# Patient Record
Sex: Female | Born: 1935 | Race: White | Hispanic: No
Health system: Midwestern US, Community
[De-identification: ages and names within clinical notes are randomized; demographics above are authoritative.]

## PROBLEM LIST (undated history)

## (undated) DIAGNOSIS — E039 Hypothyroidism, unspecified: Secondary | ICD-10-CM

## (undated) DIAGNOSIS — N649 Disorder of breast, unspecified: Secondary | ICD-10-CM

## (undated) DIAGNOSIS — Z1231 Encounter for screening mammogram for malignant neoplasm of breast: Secondary | ICD-10-CM

## (undated) DIAGNOSIS — R928 Other abnormal and inconclusive findings on diagnostic imaging of breast: Secondary | ICD-10-CM

---

## 2008-06-04 LAB — LIPID PANEL
Cholesterol, Total: 191 mg/dl (ref <200)
HDL: 79 mg/dL — ABNORMAL HIGH (ref 40–60)
LDL Calculated: 104 mg/dl — ABNORMAL HIGH (ref <100)
Triglycerides: 43 mg/dl (ref <150)
VLDL Cholesterol Calculated: 9 mg/dl

## 2008-06-04 LAB — COMPREHENSIVE METABOLIC PANEL
ALT: 21 U/L (ref 10–40)
AST: 31 U/L (ref 15–37)
Albumin: 4.1 g/dL (ref 3.4–5.0)
Alkaline Phosphatase: 41 U/L (ref 25–100)
BUN: 16 mg/dL (ref 7–18)
CO2: 32 meq/L (ref 21–32)
Calcium: 9.2 mg/dL (ref 8.3–10.6)
Chloride: 105 meq/L (ref 99–110)
Creatinine: 0.9 mg/dL (ref 0.6–1.2)
GFR Est, African/Amer: >60
GFR, Estimated: >60 (ref >60)
Glucose: 67 mg/dL — ABNORMAL LOW (ref 70–99)
Potassium: 4.4 meq/L (ref 3.5–5.1)
Sodium: 143 meq/L (ref 136–145)
Total Bilirubin: 0.5 mg/dL (ref 0.0–1.0)
Total Protein: 6.5 g/dL (ref 6.4–8.2)

## 2008-06-04 LAB — TSH: TSH: 3.71 u[IU]/mL (ref 0.35–5.5)

## 2009-05-13 LAB — TSH: TSH: 4.02 u[IU]/mL (ref 0.35–5.5)

## 2010-03-24 ENCOUNTER — Ambulatory Visit: Admit: 2010-03-24

## 2010-03-24 ENCOUNTER — Encounter

## 2010-04-22 ENCOUNTER — Ambulatory Visit: Admit: 2010-04-22

## 2010-06-26 LAB — TSH
T4 Free: 1.18 ng/dl (ref 0.9–1.8)
TSH: 5.84 u[IU]/mL — ABNORMAL HIGH (ref 0.35–5.5)

## 2010-06-26 LAB — LIPID PANEL
Cholesterol, Total: 229 mg/dl — ABNORMAL HIGH (ref <200)
HDL: 94 mg/dl — ABNORMAL HIGH (ref 40–60)
LDL Calculated: 124 mg/dl — ABNORMAL HIGH (ref <100)
Triglycerides: 55 mg/dl (ref <150)
VLDL Cholesterol Calculated: 11 mg/dl

## 2010-06-26 LAB — COMPREHENSIVE METABOLIC PANEL
ALT: 23 U/L (ref 10–40)
AST: 29 U/L (ref 15–37)
Albumin: 4.4 gm/dl (ref 3.4–5.0)
Alkaline Phosphatase: 42 U/L — ABNORMAL LOW (ref 45–129)
BUN: 13 mg/dl (ref 7–18)
CO2: 31 mEq/L (ref 21–32)
Calcium: 9.3 mg/dl (ref 8.3–10.6)
Chloride: 98 mEq/L — ABNORMAL LOW (ref 99–110)
Creatinine: 0.9 mg/dl (ref 0.6–1.2)
GFR Est, African/Amer: >60
GFR, Estimated: >60 (ref >60)
Glucose: 86 mg/dl (ref 70–99)
Potassium: 3.8 mEq/L (ref 3.5–5.1)
Sodium: 137 mEq/L (ref 136–145)
Total Bilirubin: 0.63 mg/dl (ref 0.0–1.0)
Total Protein: 6.9 gm/dl (ref 6.4–8.2)

## 2010-09-20 LAB — TSH: TSH: 1.26 u[IU]/mL (ref 0.35–5.5)

## 2010-11-08 ENCOUNTER — Encounter

## 2010-11-08 NOTE — Telephone Encounter (Signed)
Dose and allergies verified with her paper chart.  Last OV: 09/026/12  Last TSH 09/20/2010:  1.26

## 2011-10-10 ENCOUNTER — Encounter

## 2011-10-20 ENCOUNTER — Encounter

## 2011-10-27 ENCOUNTER — Encounter

## 2011-10-27 NOTE — Telephone Encounter (Signed)
ordered

## 2011-10-27 NOTE — Telephone Encounter (Signed)
On 10/10 patient had a left breast comb view and a dexa scan.  Please fax these orders so they can put into Epic. They need them to complete her chart.  Fax number [phone]

## 2011-10-27 NOTE — Telephone Encounter (Signed)
Who is asking for these orders. I wouldn't think the tests would be done s the order?

## 2011-10-27 NOTE — Telephone Encounter (Signed)
Contacted Lisa at the [facility] and they said they never received the orders. She says that the patient was already scheduled and went ahead and did the tests and would get the orders later. She says the patient had a dexa scan and left breast comb view on 10/20/2011. Would like these orders faxed to [phone].

## 2011-11-03 ENCOUNTER — Inpatient Hospital Stay: Admit: 2011-11-03 | Discharge: 2011-11-03 | Disposition: A | Discharge status: Home / Self Care

## 2011-11-03 NOTE — Discharge Instructions (Signed)

## 2011-11-03 NOTE — ED Provider Notes (Signed)
Patient is a 78 y.o. female presenting with skin laceration. The history is provided by the patient.   Laceration   The incident occurred 1 to 2 hours ago. Pain location: right lower leg. Size: There are 2 wounds.  The longer wound is 7 cm and the second, shorter wound is 4 cm. Injury mechanism: The patient states she walked into a dark room in her house and bumped her leg against a wicker chest. The pain is at a severity of 5/10. The pain has been constant since onset. She reports no foreign bodies present. Her tetanus status is UTD.       Review of Systems   Constitutional: Negative for fever and appetite change.   Musculoskeletal: Negative for back pain, joint swelling and gait problem.   Skin: Positive for wound (Lacerations to the right pretibial region).   Neurological: Negative for weakness and numbness.   All other systems reviewed and are negative.      I have reviewed the following from the nursing documentation:      Prior to Admission medications    Medication Sig Start Date End Date Taking? Authorizing Provider   hydrochlorothiazide (HYDRODIURIL) 25 MG tablet Take 25 mg by mouth daily.   Yes Historical Provider, MD   oxybutynin (DITROPAN-XL) 10 MG CR tablet Take 10 mg by mouth daily.   Yes Historical Provider, MD   clonazePAM (KLONOPIN) 0.5 MG tablet Take 0.5 mg by mouth 2 times daily as needed.   Yes Historical Provider, MD   aspirin 81 MG chewable tablet Take 81 mg by mouth daily.   Yes Historical Provider, MD   traZODone (DESYREL) 50 MG tablet Take 50 mg by mouth nightly.   Yes Historical Provider, MD   levothyroxine (LEVOTHROID) 75 MCG tablet Take 1 tablet by mouth daily. 11/08/10  Yes Montine Circle, MD       Allergies as of 11/03/2011 - Review Complete 11/03/2011   Allergen Reaction Noted   ??? Tape (adhesive tape)  11/08/2010       No past medical history on file.     Surgical History: No past surgical history on file.     Family History:  No family history on file.    History     Social History    ??? Marital Status: Widowed     Spouse Name: N/A     Number of Children: N/A   ??? Years of Education: N/A     Occupational History   ??? Not on file.     Social History Main Topics   ??? Smoking status: Not on file   ??? Smokeless tobacco: Not on file   ??? Alcohol Use: Not on file   ??? Drug Use: Not on file   ??? Sexually Active: Not on file     Other Topics Concern   ??? Not on file     Social History Narrative   ??? No narrative on file       Physical Exam   Vitals reviewed.  Constitutional: She is oriented to person, place, and time. She appears well-developed and well-nourished.   HENT:   Head: Normocephalic and atraumatic.   Right Ear: External ear normal.   Left Ear: External ear normal.   Nose: Nose normal.   Mouth/Throat: Oropharynx is clear and moist.   Eyes: Conjunctivae are normal. Right eye exhibits no discharge. Left eye exhibits no discharge. No scleral icterus.   Neck: Normal range of motion.   Cardiovascular: Intact distal  pulses.    Pulmonary/Chest: Effort normal.   Musculoskeletal:        Right lower leg: She exhibits tenderness and laceration (Two pretibial lacerations/skin tears.  The longer, more inferior wound is approximately 7 cm.  The more superior one is 4 cm.). She exhibits no bony tenderness, no edema and no deformity.        Legs:  Neurological: She is alert and oriented to person, place, and time.   Skin: Skin is warm and dry.   Psychiatric: She has a normal mood and affect.       Procedures  PROCEDURE:  LACERATION REPAIR  Cherlynn Perches or their surrogate had an opportunity to ask questions, and the risks, benefits, and alternatives were discussed. The wound was prepped and draped to maintain a sterile field. A local anesthetic was used to completely anesthetize the wound. It was copiously irrigated. It was explored to its depth in a bloodless field with no sign of tendon, nerve, or vascular injury. No foreign bodies were identified.  The 7 cm laceration/skin tear was approximated with 7 simple  interrupted 4-0 Prolene sutures.  The 4 cm laceration was approximated using 6 simple interrupted 4-0 Prolene sutures. There were no complications during the procedure.  The more inferior part of the 7 cm wound still gaped even with sutures in place.  This was bandaged with mepilex, gauze,Kling and Coban.  I recommended followup with the wound care center.    ED COURSE:  I have independently evaluated this patient.  Her wounds are repaired and bandage.  She is given outpatient followup with her family physician in the wound care center at [facility].  She believes her tetanus vaccination is up-to-date.    MDM    I estimate there is LOW risk for FRACTURE, CELLULITIS, COMPARTMENT SYNDROME, NECROTIZING FASCIITIS, TENDON OR NEUROVASCULAR INJURY, or FOREIGN BODY, thus I consider the discharge disposition reasonable. Also, there is no evidence or peritonitis, sepsis, or toxicity. Cherlynn Perches and I have discussed the diagnosis and risks, and we agree with discharging home to follow-up with their primary doctor. We also discussed returning to the Emergency Department immediately if new or worsening symptoms occur. We have discussed the symptoms which are most concerning (e.g., changing or worsening pain, fever, numbness, weakness, cool or painful digits) that necessitate immediate return.  Final Impression    1. Laceration of right lower leg x 2        Discharge Vital Signs:  Blood pressure 170/95, pulse 75, temperature 97.7 ??F (36.5 ??C), temperature source Oral, resp. rate 16, height 5' 7"$  (1.702 m), weight 145 lb (65.772 kg), SpO2 96.00%.      Almedia Balls, [place]  11/03/11 0221

## 2011-11-03 NOTE — ED Notes (Signed)
Irrigated laceration to right shin with normal saline and hibiclens. Covered with sterile gauze. Patient resting with call light in reach.    Lyndee Hensen  11/03/11 [phone]

## 2011-11-23 NOTE — Discharge Instructions (Signed)
PHYSICIAN ORDERS AND DISCHARGE INSTRUCTIONS    Anesthetic applied to wound in Wound Care Center:  __x__ 4% lidocaine      __x__ 5% lidocaine      Other:____________________      Destiny Salinas [birth year]      Keep weight off wounds and reposition every 2 hours.  Avoid standing for long periods of time.  Apply wraps/stockings in AM and remove at bedtime.  Elevate legs to the level of the heart or above for 30 minutes 4-5 times a day and/or when sitting.  When taking antibiotics take entire prescription as ordered by MD do not stop taking until medicine is all gone.        Wound care:   Clean wound prior to dressing changes with soap and water or saline.  Right Pretib:       Moisten 12 inches Packing gauze with  Safegel and pack towards your head, your foot and to       the outside of your leg       Apply Safegel to the open wound and cover with a dry dressing.         Change daily       Apply single layer tubigrip( light compression) Remove at bedtime         Return on Friday to see Dr Jerald Kief for further evacuation of hematoma,         .         Follow up with Demetria Pore, CNP  In 1 weeks in the wound care center  Call [phone] for any questions or concerns.  Your case manager is Olegario Messier, Char[employer] fundraiser.      Date__________   Time____________      Physician Signature:________________________  Date:___________Time:__________      Labs________________  Xrays________________  Diagnostic tests___________  Referrals_________________  Skin substitutes____________

## 2011-11-23 NOTE — Progress Notes (Signed)
Wound Care Progress Note      Destiny Salinas  AGE: 78 y.o.   GENDER: female  DOD: 01/24/1932  TODAY'S DATE:  11/23/2011    Subjective:       Destiny Salinas is a 78 y.o. female who presents today for wound check. Patient has a traumatic wound which is located on the right pretibial. Current findings:: Wound from fall/trauma approximately 4 weeks ago. Received sutures which have been removed. No pain.[name]       PAST MEDICAL HISTORY        Diagnosis Date   ??? Thyroid disease      hypothyroidism   ??? Psychiatric problem      depression   ??? Hypertension    ??? Urinary frequency        MEDICATIONS    Current Outpatient Prescriptions on File Prior to Encounter   Medication Sig Dispense Refill   ??? ibuprofen (ADVIL;MOTRIN) 200 MG tablet Take 600 mg by mouth every 6 hours as needed.       ??? hydrochlorothiazide (HYDRODIURIL) 25 MG tablet Take 25 mg by mouth daily.       ??? oxybutynin (DITROPAN-XL) 10 MG CR tablet Take 10 mg by mouth daily.       ??? clonazePAM (KLONOPIN) 0.5 MG tablet Take 0.5 mg by mouth 2 times daily as needed.       ??? aspirin 81 MG chewable tablet Take 81 mg by mouth daily.       ??? traZODone (DESYREL) 50 MG tablet Take 50 mg by mouth nightly.       ??? levothyroxine (LEVOTHROID) 75 MCG tablet Take 1 tablet by mouth daily.  90 tablet  3     No current facility-administered medications on file prior to encounter.       ALLERGIES    Allergies   Allergen Reactions   ??? Tape (Adhesive Tape)      And Tedhose       PAST SURGICAL HISTORY    Past Surgical History   Procedure Laterality Date   ??? Fracture surgery       Rt ankle with ORIF w/ 3 pins   ??? Knee arthroscopy       right knee torn meniscus   ??? Appendectomy     ??? Tonsillectomy     ??? Colonoscopy  2006   ??? Eye surgery       bil. cataracts removed w/ lens implants   ??? Hysterectomy         FAMILY HISTORY    family history is not on file.    SOCIAL HISTORY    History   Substance Use Topics   ??? Smoking status: Never Smoker    ???  Smokeless tobacco: Not on file   ??? Alcohol Use: 8.4 oz/week     14 Glasses of wine per week       REVIEW OF SYSTEMS    Patient is awake and alert. MAE. Respirations easy and unlabored. Breath sounds clear A & P. No cough of SOB. No fever, chills, N/V, or CP. Pulse regular. No murmer. PT and DP palpable. Slight edema of right lower leg.       Objective:      BP 140/87   Pulse 73   Temp(Src) 97.4 ??F (36.3 ??C)   Resp 18   Ht 5\' 7"  (1.702 m)   Wt 144 lb (65.318 kg)   BMI 22.55 kg/m2    Wound:   Right lower  leg with dried eschar 70% fluctuant, fading ecchymosis, and dried fibrin. Edema present with hematoma underneath. No s/s infection. No erythema, purulence or induration      (this wound was originally measured on:)  Wound 11/23/11 Other (Comment) Pretibial Right wound #1 Right Pretib-upper (onset 11/02/11)-Wound Length (cm):  (post debridement)  Wound 11/23/11 Other (Comment) Pretibial Right wound #2 Right Pretib-lower (onset 11/02/11)-Wound Length (cm): 4.8 cm (post debridement)  Wound 11/23/11 Other (Comment) Pretibial Right wound #1 Right Pretib-upper (onset 11/02/11)-Wound Width (cm): 2.5 cm  Wound 11/23/11 Other (Comment) Pretibial Right wound #2 Right Pretib-lower (onset 11/02/11)-Wound Width (cm): 3.5 cm  Wound 11/23/11 Other (Comment) Pretibial Right wound #1 Right Pretib-upper (onset 11/02/11)-Depth (cm) : 0.1  Wound 11/23/11 Other (Comment) Pretibial Right wound #2 Right Pretib-lower (onset 11/02/11)-Depth (cm) : 0. Measurements shown are from today's visit.    Assessment:     Patient Active Problem List   Diagnosis   ??? Abnormal mammogram   ??? Menopausal state            Wound check. Care provided includes visual inspection.  Cleansing and thorough irrigation with nss solution and wound packing with wound gel on strip gauze.       Procedure:  With the patient in siting position, Lidocaine soaked gauze was applied per physician order at beginning of wound evaluation. It was subsequently removed.    Using a  scalpel, pickups and scissors ,  the wound was debrided sharply of hematoma,  Fibrotic and necrotic tissue, including a layer of surrounding healthy tissue to stimulate epithelization.    The skin was excised through the level of the subcutaneous tissue .      Wound was copiously irrigated with normal saline solution.   Bleeding was with a small amount of bleeding, and controlled with pressure .    Patient tolerated procedure well and was given proper instruction.  Surgical consult for Dr. Leavy Cella 11/25/11.    Plan:     Plan for wound  Dress per physician order  Treatment: Topical Gel/Ointment and Compression Wrap/Stockings      1. Discussed appropriate home care of this wound.  2. Patient instructions were given.  3. Follow up: 1 week.     Destiny Salinas                                                   [name]

## 2011-11-25 NOTE — Progress Notes (Signed)
Wound Care Progress Note      Destiny Salinas  AGE: 78 y.o.   GENDER: female  DOB: 29-May-1932  TODAY'S DATE:  11/25/2011    Subjective:       Destiny Salinas is a 78 y.o. female who presents today for wound check. Patient has a traumatic wound which is located on the right leg. Current findings:: new: blood clot under calf skin.       PAST MEDICAL HISTORY        Diagnosis Date   ??? Thyroid disease      hypothyroidism   ??? Psychiatric problem      depression   ??? Hypertension    ??? Urinary frequency        MEDICATIONS    Current Outpatient Prescriptions on File Prior to Encounter   Medication Sig Dispense Refill   ??? ibuprofen (ADVIL;MOTRIN) 200 MG tablet Take 600 mg by mouth every 6 hours as needed.       ??? hydrochlorothiazide (HYDRODIURIL) 25 MG tablet Take 25 mg by mouth daily.       ??? oxybutynin (DITROPAN-XL) 10 MG CR tablet Take 10 mg by mouth daily.       ??? clonazePAM (KLONOPIN) 0.5 MG tablet Take 0.5 mg by mouth 2 times daily as needed.       ??? aspirin 81 MG chewable tablet Take 81 mg by mouth daily.       ??? traZODone (DESYREL) 50 MG tablet Take 50 mg by mouth nightly.       ??? levothyroxine (LEVOTHROID) 75 MCG tablet Take 1 tablet by mouth daily.  90 tablet  3     No current facility-administered medications on file prior to encounter.       ALLERGIES    Allergies   Allergen Reactions   ??? Tape (Adhesive Tape)      And Tedhose       PAST SURGICAL HISTORY    Past Surgical History   Procedure Laterality Date   ??? Fracture surgery       Rt ankle with ORIF w/ 3 pins   ??? Knee arthroscopy       right knee torn meniscus   ??? Appendectomy     ??? Tonsillectomy     ??? Colonoscopy  2006   ??? Eye surgery       bil. cataracts removed w/ lens implants   ??? Hysterectomy         FAMILY HISTORY    family history is not on file.    SOCIAL HISTORY    History   Substance Use Topics   ??? Smoking status: Never Smoker    ??? Smokeless tobacco: Not on file   ??? Alcohol Use: 8.4 oz/week     14 Glasses of wine per week        REVIEW OF SYSTEMS    Pertinent items are noted in HPI.      Objective:      There were no vitals taken for this visit.    Wound:   wound margins intact and healing well.  No signs of infection.    (this wound was originally measured on:)  Wound 11/23/11 Other (Comment) Pretibial Right wound #1 Right Pretib-lowerr (onset 11/02/11)-Wound Length (cm): 2.5 cm  Wound 11/23/11 Other (Comment) Pretibial Right wound #2 Right Pretib-lower (onset 11/02/11)-Wound Length (cm): 2.2 cm (post debride)  Wound 11/23/11 Other (Comment) Pretibial Right wound #1 Right Pretib-lowerr (onset 11/02/11)-Wound Width (cm): 2.5 cm  Wound 11/23/11 Other (Comment) Pretibial Right wound #2 Right Pretib-lower (onset 11/02/11)-Wound Width (cm): 2 cm (post debride)  Wound 11/23/11 Other (Comment) Pretibial Right wound #1 Right Pretib-lowerr (onset 11/02/11)-Depth (cm) : 1.2  Wound 11/23/11 Other (Comment) Pretibial Right wound #2 Right Pretib-lower (onset 11/02/11)-Depth (cm) : 0.2 (post debride) Measurements shown are from today's visit.    Assessment:      Wound check. Care provided includes removal of existing dressing  visual inspection  application of clean dressing  Evacuation of blood clot under local anesthesia       Procedure:  With the patient in supine position  1 % lidocaine with epinephrine infiltration anesthesia  Using a curet,  the wound was debrided sharply of all fibrotic, necrotic, and hyperkeratotic tissue and the clot was freed up and expressed from the depths of the undermined skin-fat layer of anterior shin skin.  The open skin wound  was excised through the level of the SubQ, including subQ fat.    Wound was copiously irrigated with normal saline solution.   Bleeding was with a small amount of bleeding, and controlled with pressure .    Patient tolerated procedure well and was given proper instruction.    Problem list:    Patient Active Problem List   Diagnosis   ??? Abnormal mammogram   ??? Menopausal state       Plan:      Plan for wound  Dress per physician order  Treatment: Saline Dressing      1. Discussed appropriate home care of this wound., Dispensed dressing supplies and instructions on their use., Wound re-packed., Wound redressed., and an elastic wrap applied to close the evacuated dead space.  2. Patient instructions were given.  3. Follow up: 5 days.     Philip Aspen [facility]

## 2011-11-25 NOTE — Discharge Instructions (Addendum)
PHYSICIAN ORDERS AND DISCHARGE INSTRUCTIONS    Anesthetic applied to wound in Wound Care Center:  __x__ 4% lidocaine      __x__ 5% lidocaine      Other:_____2% w/ Epinephrine injection_____________      Destiny Salinas 05/10/1932      Keep weight off wounds and reposition every 2 hours.  Consider using Crutches and  Avoid standing for long periods of time.    Elevate legs to the level of the heart or above for 30 minutes 4-5 times a day and/or when sitting.  When taking antibiotics take entire prescription as ordered by MD do not stop taking until medicine is all gone.        Wound care:    Clean wound prior to dressing changes with soap and water or saline.  Right Pretib:      Apply normal saline wet to dry dressing, 4x4, Kerlix, Ace wrap.  Change 3 x day         Leave dressing on today that Dr. Jerald Kief applied until late tomorrow evening 11/26/11    Return on 11/30/11  to see Demetria Pore, CNP  in [facility]         .     Follow up with Demetria Pore, CNP  In 1 weeks in the wound care center  Call [phone] for any questions or concerns.  Your case manager is Olegario Messier, Charity fundraiser.      Date__________   Time____________      Physician Signature:________________________  Date:_11-15-13__________Time:__________      Labs________________  Xrays________________  Diagnostic tests___________  Referrals_________________  Skin substitutes____________

## 2011-11-25 NOTE — Plan of Care (Signed)
Problem: Impaired Skin Integrity  Goal: Skin will be intact without erythema or breakdown  Outcome: Ongoing  Patient seen in [facility] for debridement of right lower leg wound.  Dr. Jerald Kief applied normal saline wet to dry, 4x4's, kerlix, ABD and single layer medium compression Tubigrip.  Patient will return next Wednesday 11/30/11 to see Leo Rod.  Discharge instructions reviewed with patient, all questions answered, copy given to patient. Dressings were applied to all wounds per M.D. at this visit.

## 2011-11-25 NOTE — Progress Notes (Signed)
Initial Evaluation History & Physical    Patient Information:  Destiny Salinas is a 78 y.o. Caucasian female    Chief Complaints:  Sutured right anterior calf wound now filled with clot.    HOPI:  Fell, shearing right anterior calf skin loose from underlying muscle fascia and space filled with blood, now clotted.  Quarter sized flap laceration, now necrotic.    Past Medical History:   has a past medical history of Thyroid disease; Psychiatric problem; Hypertension; and Urinary frequency.     Past Surgical History:   has past surgical history that includes fracture surgery; Knee arthroscopy; Appendectomy; Tonsillectomy; Colonoscopy (2006); eye surgery; and Hysterectomy.     Medications:  Prior to Admission medications    Medication Sig Start Date End Date Taking? Authorizing Provider   ibuprofen (ADVIL;MOTRIN) 200 MG tablet Take 600 mg by mouth every 6 hours as needed.    Historical Provider, MD   hydrochlorothiazide (HYDRODIURIL) 25 MG tablet Take 25 mg by mouth daily.    Historical Provider, MD   oxybutynin (DITROPAN-XL) 10 MG CR tablet Take 10 mg by mouth daily.    Historical Provider, MD   clonazePAM (KLONOPIN) 0.5 MG tablet Take 0.5 mg by mouth 2 times daily as needed.    Historical Provider, MD   aspirin 81 MG chewable tablet Take 81 mg by mouth daily.    Historical Provider, MD   traZODone (DESYREL) 50 MG tablet Take 50 mg by mouth nightly.    Historical Provider, MD   levothyroxine (LEVOTHROID) 75 MCG tablet Take 1 tablet by mouth daily. 11/08/10   Lawrence Santiago, MD       Allergies:  Tape    Family History:  family history is not on file.    Social History:   reports that she has never smoked. She does not have any smokeless tobacco history on file. She reports that she drinks about 8.4 ounces of alcohol per week. She reports that she does not use illicit drugs.     ROS:  System review was done and pertinent positives are mentioned in the HPI.Otherwise all the other system review are negative.    Physical  Exam:  There were no vitals taken for this visit.  General appearance: alert, appears stated age and cooperative  Lungs: clear to auscultation bilaterally  Heart: regular rate and rhythm, S1, S2 normal, no murmur, click, rub or gallop  Abdomen: soft, non-tender; bowel sounds normal; no masses,  no organomegaly  Extremities: palm sized retained clot right anterior calf  Neurologic: Grossly normal  Skin: Skin color, texture, turgor normal. No rashes or lesions    Assessment:  Flensing wound of right calf    Plan:  1. Evacuate clot  2. Compressive dressing  3. Wet to damp center dressing.    Ann Lions, MD 11/25/2011 1:38 PM

## 2011-11-29 NOTE — Progress Notes (Signed)
Talked with Dr. Jerald Kief today re obtaining snap vac for pt- he would have to write a letter of neccesity and rep would need contacted. Pt is leaving for [place] at the end the week. Will hold off on vac for now until pt returns and will be re-evaluated. Pt reports that her wound is less painful, has decreased drainage, seems to be healing well. Pt to see Huntley Dec 11-30-11 in [facility]

## 2011-11-30 NOTE — Plan of Care (Signed)
Problem: Impaired Skin Integrity  Goal: Skin will be intact without erythema or breakdown  Intervention: WOUND CARE  Pt decided that her wound is doing well and she has difficulty getting an appointment in [place]  Her dressings are changed to [employer]

## 2011-11-30 NOTE — Plan of Care (Signed)
Problem: Impaired Skin Integrity  Goal: Skin will be intact without erythema or breakdown  Outcome: Ongoing  Wound is doing much better, no undermining today.  Pt does not want NPWT because she feels the wound  Is getting smaller.  She feels like she can do the dressing herself and was not able to find a time that would work for her visit to [facility]

## 2011-11-30 NOTE — Discharge Instructions (Signed)
PHYSICIAN ORDERS AND DISCHARGE INSTRUCTIONS    Anesthetic applied to wound in Wound Care Center:  __x__ 4% lidocaine      __x__ 5% lidocaine      Other:_____2% w/ Epinephrine injection_____________      Nigel Bridgeman [birth year]      Keep weight off wounds and reposition every 2 hours.  Consider using Crutches and  Avoid standing for long periods of time.    Elevate legs to the level of the heart or above for 30 minutes 4-5 times a day and/or when sitting.  When taking antibiotics take entire prescription as ordered by MD do not stop taking until medicine is all gone.        Wound care:    Clean wound prior to dressing changes with soap and water or saline.  Right Pretib:       A silver dressing has been placed in your wound.       The silver keeps the bacterial count  down.      Cut the dressing slightly larger than the wound and wet it if your wound is dry.  If your       wound is moist, the dressing will absorb the moisture and it is not necessary to wet it.  Cover with a dry dressing, Change daily         .     Follow up   in 2  weeks in the wound care center  Call [phone] for any questions or concerns.  Your case manager is Olegario Messier, Char[employer] fundraiser.  Go to an urgent care center if the wound drainage is foul smelling, redness develops, increased heat to the area    Date__________   Time____________      Physician Signature:________________________  Date:_11-15-13__________Time:__________      Labs________________  Xrays________________  Diagnostic tests___________  Referrals_________________  Skin substitutes____________

## 2011-11-30 NOTE — Progress Notes (Signed)
Wound Care Progress Note      Destiny Salinas  AGE: 78 y.o.   GENDER: female  DOD: [birth year]  TODAY'S DATE:  11/30/2011    Subjective:       Destiny Salinas is a 78 y.o. female who presents today for wound check. Patient has a traumatic wound which is located on the right lower leg. Current findings:: wound healing as expected  Plans for going out of town tomorrow. Unable to get appointment with wound clinic when out of town.[name]       PAST MEDICAL HISTORY        Diagnosis Date   ??? Thyroid disease      hypothyroidism   ??? Psychiatric problem      depression   ??? Hypertension    ??? Urinary frequency        MEDICATIONS    Current Outpatient Prescriptions on File Prior to Encounter   Medication Sig Dispense Refill   ??? ibuprofen (ADVIL;MOTRIN) 200 MG tablet Take 600 mg by mouth every 6 hours as needed.       ??? hydrochlorothiazide (HYDRODIURIL) 25 MG tablet Take 25 mg by mouth daily.       ??? oxybutynin (DITROPAN-XL) 10 MG CR tablet Take 10 mg by mouth daily.       ??? clonazePAM (KLONOPIN) 0.5 MG tablet Take 0.5 mg by mouth 2 times daily as needed.       ??? aspirin 81 MG chewable tablet Take 81 mg by mouth daily.       ??? traZODone (DESYREL) 50 MG tablet Take 50 mg by mouth nightly.       ??? levothyroxine (LEVOTHROID) 75 MCG tablet Take 1 tablet by mouth daily.  90 tablet  3     No current facility-administered medications on file prior to encounter.       ALLERGIES    Allergies   Allergen Reactions   ??? Tape (Adhesive Tape)      And Tedhose       PAST SURGICAL HISTORY    Past Surgical History   Procedure Laterality Date   ??? Fracture surgery       Rt ankle with ORIF w/ 3 pins   ??? Knee arthroscopy       right knee torn meniscus   ??? Appendectomy     ??? Tonsillectomy     ??? Colonoscopy  2006   ??? Eye surgery       bil. cataracts removed w/ lens implants   ??? Hysterectomy         FAMILY HISTORY    family history is not on file.    SOCIAL HISTORY    History   Substance Use Topics   ??? Smoking  status: Never Smoker    ??? Smokeless tobacco: Not on file   ??? Alcohol Use: 8.4 oz/week     14 Glasses of wine per week       REVIEW OF SYSTEMS    Pertinent items are noted in HPI.  Patient awake and alert. MAE> No fever, chills, n/v, sob, cp or reaction to topical dressing. Using cane and elevating leg      Objective:      BP 160/69   Pulse 81   Temp(Src) 97.8 ??F (36.6 ??C) (Oral)   Resp 18    Wound:   wound with red granulation and fibrin. Edges attached. Undermining resolved. Edges open. Surrounding tissue intact. Slight erythema. No c/o pain. No purulence, malodor or  induration.       (this wound was originally measured on:)  Wound 11/23/11 Other (Comment) Pretibial Right wound #1 Right Pretib-lower (onset 11/02/11)-Wound Length (cm): 1.5 cm (post debridement)  Wound 11/23/11 Other (Comment) Pretibial Right wound #2 Right Pretib-lower (onset 11/02/11)-Wound Length (cm): 3.5 cm  Wound 11/23/11 Other (Comment) Pretibial Right wound #1 Right Pretib-lower (onset 11/02/11)-Wound Width (cm): 1.7 cm  Wound 11/23/11 Other (Comment) Pretibial Right wound #2 Right Pretib-lower (onset 11/02/11)-Wound Width (cm): 3.5 cm  Wound 11/23/11 Other (Comment) Pretibial Right wound #1 Right Pretib-lower (onset 11/02/11)-Depth (cm) : 0.9  Wound 11/23/11 Other (Comment) Pretibial Right wound #2 Right Pretib-lower (onset 11/02/11)-Depth (cm) : 0.1 Measurements shown are from today's visit.    Assessment:     Patient Active Problem List   Diagnosis   ??? Abnormal mammogram   ??? Menopausal state            Wound check. Care provided includes cleansing with nss solution, application of clean dressing  And debridement       Procedure:  With the patient in siting position. Lidocaine soaked gauze was applied per physician order at beginning of wound evaluation. It was subsequently removed.    Using a curette,  the wound was debrided sharply of fibrotic tissue, including a layer of surrounding healthy tissue to stimulate epithelization.    The skin  was excised through the level of the subcutaneous tissue .      Wound was copiously irrigated with normal saline solution.   Bleeding was with a small amount of bleeding, and controlled with bleeding .    Patient tolerated procedure well and was given proper instruction.      Plan:     Plan for wound  Dress per physician order  Treatment: Topical Gel/Ointment      1. Discussed appropriate home care of this wound.  2. Patient instructions were given.  3. Follow up: 1 week.     [name]

## 2011-12-14 NOTE — Plan of Care (Signed)
Problem: Impaired Skin Integrity  Goal: Skin will be intact without erythema or breakdown  Intervention: WOUND CARE  Pt will visit next week for debridement and then will follow up with wound care in [place] as she won't be returning until May

## 2011-12-14 NOTE — Progress Notes (Signed)
Wound Care Progress Note      Destiny Salinas  AGE: 78 y.o.   GENDER: female  DOB: June 19, 1932  TODAY'S DATE:  12/14/2011    Subjective:       Destiny Salinas is a 78 y.o. female who presents today for wound check. Patient has a post-traumatic wound which is located on the right pretibial leg. Current findings:: mild pain, mild swelling controlled with compression, mild drainage, no fever. Pain localized, no radiation, better with rest/elevation.       PAST MEDICAL HISTORY        Diagnosis Date   ??? Thyroid disease      hypothyroidism   ??? Psychiatric problem      depression   ??? Hypertension    ??? Urinary frequency        MEDICATIONS    Current Outpatient Prescriptions on File Prior to Encounter   Medication Sig Dispense Refill   ??? ibuprofen (ADVIL;MOTRIN) 200 MG tablet Take 600 mg by mouth every 6 hours as needed.       ??? hydrochlorothiazide (HYDRODIURIL) 25 MG tablet Take 25 mg by mouth daily.       ??? oxybutynin (DITROPAN-XL) 10 MG CR tablet Take 10 mg by mouth daily.       ??? clonazePAM (KLONOPIN) 0.5 MG tablet Take 0.5 mg by mouth 2 times daily as needed.       ??? aspirin 81 MG chewable tablet Take 81 mg by mouth daily.       ??? traZODone (DESYREL) 50 MG tablet Take 50 mg by mouth nightly.       ??? levothyroxine (LEVOTHROID) 75 MCG tablet Take 1 tablet by mouth daily.  90 tablet  3     No current facility-administered medications on file prior to encounter.       ALLERGIES    Allergies   Allergen Reactions   ??? Tape (Adhesive Tape)      And Tedhose       PAST SURGICAL HISTORY    Past Surgical History   Procedure Laterality Date   ??? Fracture surgery       Rt ankle with ORIF w/ 3 pins   ??? Knee arthroscopy       right knee torn meniscus   ??? Appendectomy     ??? Tonsillectomy     ??? Colonoscopy  2006   ??? Eye surgery       bil. cataracts removed w/ lens implants   ??? Hysterectomy         FAMILY HISTORY    family history is not on file.    SOCIAL HISTORY    History   Substance Use  Topics   ??? Smoking status: Never Smoker    ??? Smokeless tobacco: Not on file   ??? Alcohol Use: 8.4 oz/week     14 Glasses of wine per week       REVIEW OF SYSTEMS    Pertinent items are noted in HPI.      Objective:      BP 164/82   Pulse 70   Temp(Src) 97.6 ??F (36.4 ??C) (Oral)   Resp 20    Wound:   mild periwound induration, no cellulitis, pus, odor or fluctuance. Wound bed 70% red and granulating, 25% necrotic SQ tissue, small amount of healthy exposed fascia.      (this wound was originally measured on:)  Wound 11/23/11 Other (Comment) Pretibial Right wound #1 Right Pretib-lower (onset 11/02/11)-Wound Length (cm):  0.4 cm  Wound 11/23/11 Other (Comment) Pretibial Right wound #2 Right Pretib-lower (onset 11/02/11)-Wound Length (cm): 3.2 cm (post debridement)  Wound 11/23/11 Other (Comment) Pretibial Right wound #1 Right Pretib-lower (onset 11/02/11)-Wound Width (cm): 0.4 cm  Wound 11/23/11 Other (Comment) Pretibial Right wound #2 Right Pretib-lower (onset 11/02/11)-Wound Width (cm): 2.7 cm  Wound 11/23/11 Other (Comment) Pretibial Right wound #1 Right Pretib-lower (onset 11/02/11)-Depth (cm) : 0.1  Wound 11/23/11 Other (Comment) Pretibial Right wound #2 Right Pretib-lower (onset 11/02/11)-Depth (cm) : 0.2 Measurements shown are from today's visit.    Assessment:      Wound check. Care provided includes removal of existing dressing  visual inspection  cleansing with wound cleansing solution  application of clean dressing     Procedure:  With the patient in supine position  Lidocaine soaked gauze was applied per physician order at beginning of wound   evaluation. It was subsequently removed.    Using a curette and scalpel,  the wound was debrided sharply of all fibrotic, necrotic, and hyperkeratotic tissue, including a layer of surrounding healthy tissue to stimulate epithelization.    The skin was excised through the level of the subcutaneous tissue .      Wound was copiously irrigated with normal saline solution.    Bleeding was moderate, and controlled with pressure and AgNO3 .    Patient tolerated procedure well and was given proper instruction.    Problem list:    Patient Active Problem List   Diagnosis   ??? Abnormal mammogram   ??? Menopausal state       Plan:     Plan for wound  Dress per physician order  Treatment: Other (comments)      1. Discussed appropriate home care of this wound., Wound redressed. Aquacel Ag once daily after cleansing, dry cover dressing, light compression stocking, ambulation alternating with elevation.  2. Patient instructions were given.  3. Follow up: 7 days, before she goes to [facility] for the winter - her PMD there will assist her with finding a WCC.     Bing Neighbors Tsuyako Jolley

## 2011-12-14 NOTE — Discharge Instructions (Signed)
PHYSICIAN ORDERS AND DISCHARGE INSTRUCTIONS    Anesthetic applied to wound in Wound Care Center:  __x__ 4% lidocaine      __x__ 5% lidocaine      Other:_____2% w/ Epinephrine injection_____________      Nigel Bridgeman Oct 09, 1932      Keep weight off wounds and reposition every 2 hours.  Consider using Crutches and  Avoid standing for long periods of time.    Elevate legs to the level of the heart or above for 30 minutes 4-5 times a day and/or when sitting.  When taking antibiotics take entire prescription as ordered by MD do not stop taking until medicine is all gone.        Wound care:    Clean wound prior to dressing changes with soap and water or saline.  Right Pretib:       A silver dressing has been placed in your wound.        The silver keeps the bacterial count  down.      Cut the dressing slightly larger than the wound and wet it if your wound is dry.  If your       wound is moist, the dressing will absorb the moisture and it is not necessary to wet it.  Cover with a dry dressing, Change daily         .     Follow up   in 1  weeks in the wound care center  Call [phone] for any questions or concerns.  Your case manager is Olegario Messier, [employer] fundraiser.  Go to an urgent care center if the wound drainage is foul smelling, redness develops, increased heat to the area    Date__________   Time____________      Physician Signature:________________________  Date:___________Time:__________      Labs________________  Xrays________________  Diagnostic tests___________  Referrals_________________  Skin substitutes____________

## 2011-12-21 NOTE — Progress Notes (Signed)
Wound Care Progress Note      Destiny Salinas  AGE: 78 y.o.   GENDER: female  DOB: 1932/10/19  TODAY'S DATE:  12/21/2011    Subjective:       Destiny Salinas is a 78 y.o. female who presents today for wound check. Patient has a post-traumatic wound which is located on the right pretibial area. Current findings:: no fever, minimal swelling, dry skin, thinks wound is looking better, no pus or odor. Preparing to leave for [place] soon       PAST MEDICAL HISTORY        Diagnosis Date   ??? Thyroid disease      hypothyroidism   ??? Psychiatric problem      depression   ??? Hypertension    ??? Urinary frequency        MEDICATIONS    Current Outpatient Prescriptions on File Prior to Encounter   Medication Sig Dispense Refill   ??? ibuprofen (ADVIL;MOTRIN) 200 MG tablet Take 600 mg by mouth every 6 hours as needed.       ??? hydrochlorothiazide (HYDRODIURIL) 25 MG tablet Take 25 mg by mouth daily.       ??? oxybutynin (DITROPAN-XL) 10 MG CR tablet Take 10 mg by mouth daily.       ??? clonazePAM (KLONOPIN) 0.5 MG tablet Take 0.5 mg by mouth 2 times daily as needed.       ??? aspirin 81 MG chewable tablet Take 81 mg by mouth daily.       ??? traZODone (DESYREL) 50 MG tablet Take 50 mg by mouth nightly.       ??? levothyroxine (LEVOTHROID) 75 MCG tablet Take 1 tablet by mouth daily.  90 tablet  3     No current facility-administered medications on file prior to encounter.       ALLERGIES    Allergies   Allergen Reactions   ??? Tape (Adhesive Tape)      And Tedhose       PAST SURGICAL HISTORY    Past Surgical History   Procedure Laterality Date   ??? Fracture surgery       Rt ankle with ORIF w/ 3 pins   ??? Knee arthroscopy       right knee torn meniscus   ??? Appendectomy     ??? Tonsillectomy     ??? Colonoscopy  2006   ??? Eye surgery       bil. cataracts removed w/ lens implants   ??? Hysterectomy         FAMILY HISTORY    family history is not on file.    SOCIAL HISTORY    History   Substance Use Topics   ???  Smoking status: Never Smoker    ??? Smokeless tobacco: Not on file   ??? Alcohol Use: 8.4 oz/week     14 Glasses of wine per week       REVIEW OF SYSTEMS    Pertinent items are noted in HPI.      Objective:      BP 147/75   Pulse 69   Temp(Src) 98.4 ??F (36.9 ??C) (Oral)   Resp 18    Wound:   wound margins intact and healing well.  No signs of infection.  Wound contracting, base is 80% red and granular with a bit of SQ necrosis central and inferolateral - small area of healthy exposed fascia centrally. Skin dry, good distal pulses, no rash, purulence, fluctuance, odor, cellulitis.  Some biofilm and fibrinous debris on the surface, small rim of necrotic epithelium.   (this wound was originally measured on:)  Wound 11/23/11 Other (Comment) Pretibial Right wound #2 Right Pretib-lower (onset 11/02/11)-Wound Length (cm): 2.6 cm (post debridement)  Wound 11/23/11 Other (Comment) Pretibial Right wound #1 Right Pretib-lower (onset 11/02/11)-Wound Length (cm): 0 cm  Wound 11/23/11 Other (Comment) Pretibial Right wound #2 Right Pretib-lower (onset 11/02/11)-Wound Width (cm): 1.5 cm  Wound 11/23/11 Other (Comment) Pretibial Right wound #1 Right Pretib-lower (onset 11/02/11)-Wound Width (cm): 0 cm  Wound 11/23/11 Other (Comment) Pretibial Right wound #2 Right Pretib-lower (onset 11/02/11)-Depth (cm) : 0.2  Wound 11/23/11 Other (Comment) Pretibial Right wound #1 Right Pretib-lower (onset 11/02/11)-Depth (cm) : 0 Measurements shown are from today's visit.    Assessment:      Wound check. Care provided includes removal of existing dressing  visual inspection  cleansing with wound cleansing solution  application of clean dressing     Procedure:  With the patient in supine position  Lidocaine soaked gauze was applied per physician order at beginning of wound   evaluation. It was subsequently removed.    Using a curette,  the wound was debrided sharply of all fibrotic, necrotic, and hyperkeratotic tissue, including a layer of surrounding  healthy tissue to stimulate epithelization.    The skin was excised through the level of the subcutaneous . Fibrin and biofilm debrided as well      Wound was copiously irrigated with normal saline solution.   Bleeding was mild, and controlled with pressure .    Patient tolerated procedure well and was given proper instruction.    Problem list:    Patient Active Problem List   Diagnosis   ??? Abnormal mammogram   ??? Menopausal state   ??? Wound, open, leg   ??? Edema       Plan:     Plan for wound  Dress per physician order  Treatment: Compression Wrap/Stockings and Other (comments)      1. Continue PolyMem Silver daily, skin moisuturizer, light compression wrap, alternate ambulation and elevation.   2. Patient instructions were given.  3. Follow up: in [place] wound care center within 2 weeks or so - phone numbers given to her. We're available for phone calls in the meantime certainly.     Destiny Salinas

## 2011-12-21 NOTE — Discharge Instructions (Signed)
PHYSICIAN ORDERS AND DISCHARGE INSTRUCTIONS    Anesthetic applied to wound in Wound Care Center:  __x__ 4% lidocaine      __x__ 5% lidocaine      Other:_____2% w/ Epinephrine injection_____________      Destiny Salinas 1932/07/10      Keep weight off wounds and reposition every 2 hours.  Consider using Crutches and  Avoid standing for long periods of time.    Elevate legs to the level of the heart or above for 30 minutes 4-5 times a day and/or when sitting.  When taking antibiotics take entire prescription as ordered by MD do not stop taking until medicine is all gone.        Wound care:    Clean wound prior to dressing changes with soap and water or saline.  Right Pretib:       A silver dressing has been placed in your wound.       The silver keeps the bacterial count  down.      Cut the dressing slightly larger than the wound and wet it if your wound is dry.  If your       wound is moist, the dressing will absorb the moisture and it is not necessary to wet it.  Cover with a dry dressing, Change daily Apply lotion daily         .     Follow up    weeks in the wound care center on 01/06/2012  Call [phone] [address]  Call [phone] for any questions or concerns.  Your case manager is Olegario Messier, Char[employer] fundraiser.  Go to an urgent care center if the wound drainage is foul smelling, redness develops, increased heat to the area    Date__________   Time____________      Physician Signature:________________________  Date:___________Time:__________      Labs________________  Xrays________________  Diagnostic tests___________  Referrals_________________  Skin substitutes____________

## 2011-12-21 NOTE — Plan of Care (Signed)
Problem: Impaired Skin Integrity  Goal: Skin will be intact without erythema or breakdown  Intervention: WOUND CARE  Wound is significantly smaller on the NPWT, will continue, see pt next week and then give her a break on Christmas and New Years

## 2012-08-03 ENCOUNTER — Inpatient Hospital Stay
Admit: 2012-08-05 | Disposition: A | Discharge status: Home / Self Care | Payer: MEDICARE | Source: Home / Self Care | Admitting: Internal Medicine

## 2012-08-03 DIAGNOSIS — R079 Chest pain, unspecified: Secondary | ICD-10-CM

## 2012-08-03 LAB — COMPREHENSIVE METABOLIC PANEL
ALT: 23 U/L (ref 10–40)
AST: 22 U/L (ref 15–37)
Albumin/Globulin Ratio: 2.4 — ABNORMAL HIGH (ref 1.1–2.2)
Albumin: 4.3 g/dL (ref 3.4–5.0)
Alkaline Phosphatase: 51 U/L (ref 40–129)
BUN: 18 mg/dL (ref 7–20)
CO2: 25 mmol/L (ref 21–32)
Calcium: 9 mg/dL (ref 8.3–10.6)
Chloride: 95 mmol/L — ABNORMAL LOW (ref 99–110)
Creatinine: 0.8 mg/dL (ref 0.6–1.2)
GFR African American: >60 (ref >60)
GFR Non-African American: >60 (ref >60)
Globulin: 1.8 g/dL
Glucose: 88 mg/dL (ref 70–99)
Potassium: 3.4 mmol/L — ABNORMAL LOW (ref 3.5–5.1)
Sodium: 133 mmol/L — ABNORMAL LOW (ref 136–145)
Total Bilirubin: <0.2 mg/dL (ref 0.0–1.0)
Total Protein: 6.1 g/dL — ABNORMAL LOW (ref 6.4–8.2)

## 2012-08-03 LAB — CBC WITH AUTO DIFFERENTIAL
Basophils %: 1.7 %
Basophils Absolute: 0.1 10*3/uL (ref 0.0–0.2)
Eosinophils %: 4.4 %
Eosinophils Absolute: 0.3 10*3/uL (ref 0.0–0.6)
Hematocrit: 34.9 % — ABNORMAL LOW (ref 36.0–48.0)
Hemoglobin: 11.6 g/dL — ABNORMAL LOW (ref 12.0–16.0)
Lymphocytes %: 32.1 %
Lymphocytes Absolute: 2.2 10*3/uL (ref 1.0–5.1)
MCH: 29.6 pg (ref 26.0–34.0)
MCHC: 33.3 g/dL (ref 31.0–36.0)
MCV: 89 fL (ref 80.0–100.0)
MPV: 9.2 fL (ref 5.0–10.5)
Monocytes %: 8.2 %
Monocytes Absolute: 0.6 10*3/uL (ref 0.0–1.3)
Neutrophils %: 53.6 %
Neutrophils Absolute: 3.7 10*3/uL (ref 1.7–7.7)
Platelets: 222 10*3/uL (ref 135–450)
RBC: 3.92 M/uL — ABNORMAL LOW (ref 4.00–5.20)
RDW: 13.2 % (ref 12.4–15.4)
WBC: 6.9 10*3/uL (ref 4.0–11.0)

## 2012-08-03 LAB — TROPONIN
Troponin: <0.01 ng/mL (ref <0.01)
Troponin: <0.01 ng/mL (ref <0.01)
Troponin: <0.01 ng/mL (ref <0.01)

## 2012-08-03 LAB — T4, FREE: T4 Free: 1.4 ng/ml (ref 0.9–1.8)

## 2012-08-03 LAB — TSH: TSH: 2.07 u[IU]/mL (ref 0.27–4.20)

## 2012-08-03 MED ORDER — ACETAMINOPHEN 325 MG PO TABS
325 | ORAL | Status: DC | PRN
Start: 2012-08-03 — End: 2012-08-08
  Administered 2012-08-03 – 2012-08-08 (×6): 650 mg via ORAL

## 2012-08-03 MED ORDER — OXYCODONE HCL 5 MG PO TABS
5 | ORAL | Status: DC | PRN
Start: 2012-08-03 — End: 2012-08-08

## 2012-08-03 MED ORDER — MORPHINE SULFATE (PF) 2 MG/ML IV SOLN
2 | INTRAVENOUS | Status: DC | PRN
Start: 2012-08-03 — End: 2012-08-08
  Administered 2012-08-08: 05:00:00 2 mg via INTRAVENOUS

## 2012-08-03 MED ORDER — IBUPROFEN 600 MG PO TABS
600 | Freq: Four times a day (QID) | ORAL | Status: DC | PRN
Start: 2012-08-03 — End: 2012-08-08
  Administered 2012-08-07 – 2012-08-08 (×2): 600 mg via ORAL

## 2012-08-03 MED ADMIN — lisinopril (PRINIVIL;ZESTRIL) tablet 20 mg: ORAL | @ 13:00:00 | NDC 68084019811

## 2012-08-03 MED ADMIN — levothyroxine (SYNTHROID) tablet 75 mcg: ORAL | @ 13:00:00 | NDC 51079044001

## 2012-08-03 MED ADMIN — nitroglycerin (NITRO-BID) 2 % ointment 0.5 inch: TOPICAL | @ 07:00:00 | NDC 00281032608

## 2012-08-03 MED ADMIN — potassium chloride (K-DUR) tablet 40 mEq: ORAL | @ 21:00:00 | NDC 68084052411

## 2012-08-03 MED ADMIN — enoxaparin (LOVENOX) injection 40 mg: SUBCUTANEOUS | @ 13:00:00 | NDC 00075801401

## 2012-08-03 MED ADMIN — technetium tetrofosmin (Tc-MYOVIEW) injection 16 milli Curie: INTRAVENOUS | @ 12:00:00 | NDC 17156002405

## 2012-08-03 MED ADMIN — aspirin chewable tablet 243 mg: ORAL | @ 07:00:00 | NDC 66553000201

## 2012-08-03 MED ADMIN — sodium chloride flush 0.9 % injection 10 mL: INTRAVENOUS | @ 14:00:00

## 2012-08-03 MED ADMIN — aspirin chewable tablet 81 mg: ORAL | @ 13:00:00 | NDC 66553000201

## 2012-08-03 MED ADMIN — lisinopril (PRINIVIL;ZESTRIL) tablet 20 mg: ORAL | @ 16:00:00 | NDC 68084019811

## 2012-08-03 MED ADMIN — isosorbide mononitrate (IMDUR) CR tablet 30 mg: ORAL | @ 23:00:00 | NDC 00143223001

## 2012-08-03 MED ADMIN — potassium chloride (K-DUR) tablet 40 mEq: ORAL | @ 16:00:00 | NDC 68084052411

## 2012-08-03 MED ADMIN — metoprolol (TOPROL-XL) XL tablet 25 mg: ORAL | @ 13:00:00 | NDC 68084030311

## 2012-08-03 MED ADMIN — technetium tetrofosmin (Tc-MYOVIEW) injection 33.3 milli Curie: INTRAVENOUS | @ 14:00:00 | NDC 17156002405

## 2012-08-03 MED ADMIN — oxybutynin (DITROPAN-XL) CR tablet 10 mg: ORAL | @ 13:00:00 | NDC 00378660501

## 2012-08-03 MED ADMIN — hydrALAZINE (APRESOLINE) injection 10 mg: INTRAVENOUS | @ 16:00:00 | NDC 63323061401

## 2012-08-03 MED FILL — HYDRALAZINE HCL 20 MG/ML IJ SOLN: 20 MG/ML | INTRAMUSCULAR | Qty: 1

## 2012-08-03 MED FILL — NITRO-BID 2 % TD OINT: 2 % | TRANSDERMAL | Qty: 1

## 2012-08-03 MED FILL — ACETAMINOPHEN 325 MG PO TABS: 325 MG | ORAL | Qty: 2

## 2012-08-03 MED FILL — LOVENOX 40 MG/0.4ML SC SOLN: 40 MG/0.4ML | SUBCUTANEOUS | Qty: 0.4

## 2012-08-03 MED FILL — TOPROL XL 25 MG PO TB24: 25 MG | ORAL | Qty: 1

## 2012-08-03 MED FILL — ASPIRIN LOW STRENGTH 81 MG PO CHEW: 81 MG | ORAL | Qty: 1

## 2012-08-03 MED FILL — OXYBUTYNIN CHLORIDE ER 5 MG PO TB24: 5 MG | ORAL | Qty: 2

## 2012-08-03 MED FILL — KAON-CL-10 10 MEQ PO TBCR: 10 MEQ | ORAL | Qty: 4

## 2012-08-03 MED FILL — LEVOTHYROXINE SODIUM 25 MCG PO TABS: 25 MCG | ORAL | Qty: 1

## 2012-08-03 MED FILL — ISOSORBIDE MONONITRATE ER 30 MG PO TB24: 30 MG | ORAL | Qty: 1

## 2012-08-03 MED FILL — LISINOPRIL 20 MG PO TABS: 20 MG | ORAL | Qty: 1

## 2012-08-03 MED FILL — ASPIRIN LOW STRENGTH 81 MG PO CHEW: 81 MG | ORAL | Qty: 3

## 2012-08-03 NOTE — ED Notes (Signed)
205-1 @ 0353    Peggye Pitt, RN  08/03/12 [phone]

## 2012-08-03 NOTE — ED Notes (Signed)
Pt returned from radiology Dr. Janetta Hora at bedside     Christa See, RN  08/03/12 [phone]

## 2012-08-03 NOTE — ED Notes (Signed)
Pt states that she was at home when she took her blood pressure and it read high. Pt states she took extra blood pressure meds and drank a glass of wine which still did not help. Pt was advised to go to the ER.     Christa See, RN  08/03/12 [phone]

## 2012-08-03 NOTE — Consults (Signed)
[facility]   CONSULTATION  [phone]      Attending Physician: Donne Hazel, MD  Reason for Consultation/Chief Complaint: + stress test    Subjective   History of Present Illness:  Destiny Salinas is a 79 y.o. patient who presented to the hospital with complaints of HTN. She states that she came in because she checked home BP  And it was 199/110 and so she came in for eval. Usually 120/70. Lately 150/90 for past year. No missed Rx doses. Lots of driving to [place]. Tired after but no SOB. No new OTC meds. Lots of NSAID recently due to back pain. But no recent cough/flu. Does feel a pressure or heaviness "achiness". Swims a mile a day but noted recently that not able to do as much and 'lost swimming rhythm' Being doing constantly since 05/28/1966, husband died last year. No CAD or MI.     Past Medical History:   has a past medical history of Thyroid disease; Psychiatric problem; Hypertension; and Urinary frequency.    Surgical History:   has past surgical history that includes fracture surgery; Knee arthroscopy; Appendectomy; Tonsillectomy; Colonoscopy 05/27/2004); eye surgery; and Hysterectomy.     Social History:   reports that she has never smoked. She does not have any smokeless tobacco history on file. She reports that she drinks about 8.4 ounces of alcohol per week. She reports that she does not use illicit drugs.     Family History:  family history is not on file.  Mother- died of MI    Home Medications:  Were reviewed and are listed in nursing record and/or below  Prior to Admission medications    Medication Sig Start Date End Date Taking? Authorizing Provider   lisinopril (PRINIVIL;ZESTRIL) 20 MG tablet Take 20 mg by mouth daily.   Yes Historical Provider, MD   metoprolol (TOPROL XL) 25 MG XL tablet Take 25 mg by mouth 2 times daily.   Yes Historical Provider, MD   ibuprofen (ADVIL;MOTRIN) 200 MG tablet Take 600 mg by mouth every 6 hours as needed.   Yes Historical Provider, MD     oxybutynin (DITROPAN-XL) 10 MG CR tablet Take 10 mg by mouth daily.   Yes Historical Provider, MD   clonazePAM (KLONOPIN) 0.5 MG tablet Take 0.5 mg by mouth 2 times daily as needed.   Yes Historical Provider, MD   aspirin 81 MG chewable tablet Take 81 mg by mouth daily.   Yes Historical Provider, MD   traZODone (DESYREL) 50 MG tablet Take 50 mg by mouth nightly.   Yes Historical Provider, MD   levothyroxine (LEVOTHROID) 75 MCG tablet Take 1 tablet by mouth daily. 11/08/10  Yes Lawrence Santiago, MD        CURRENT Medications:    aspirin chewable tablet 81 mg Daily   clonazePAM (KLONOPIN) tablet 0.5 mg BID PRN   ibuprofen (ADVIL;MOTRIN) tablet 600 mg Q6H PRN   levothyroxine (SYNTHROID) tablet 75 mcg QAM AC   metoprolol (TOPROL-XL) XL tablet 25 mg BID   oxybutynin (DITROPAN-XL) CR tablet 10 mg Daily   traZODone (DESYREL) tablet 50 mg Nightly   sodium chloride flush 0.9 % injection 10 mL Q12H SCH   sodium chloride flush 0.9 % injection 10 mL PRN   acetaminophen (TYLENOL) tablet 650 mg Q4H PRN   magnesium hydroxide (MILK OF MAGNESIA) 400 MG/5ML suspension 30 mL Daily PRN   ondansetron (ZOFRAN) injection 4 mg Q6H PRN   enoxaparin (LOVENOX) injection  40 mg Daily   oxyCODONE (ROXICODONE) immediate release tablet 5 mg Q4H PRN   Or    oxyCODONE (ROXICODONE) immediate release tablet 10 mg Q4H PRN   morphine (PF) injection 2 mg Q2H PRN   Or    morphine (PF) injection 4 mg Q2H PRN   nitroGLYCERIN (NITROSTAT) SL tablet 0.4 mg Q5 Min PRN   nitroglycerin (NITRO-BID) 2 % ointment 0.5 inch Q6H SCH   regadenoson (LEXISCAN) injection SOLN 0.4 mg Once   hydrALAZINE (APRESOLINE) injection 10 mg Q6H PRN   potassium chloride (K-DUR) tablet 40 mEq Q4H SCH   [START ON 08/04/2012] lisinopril (PRINIVIL;ZESTRIL) tablet 40 mg Daily       Allergies:  Tape     Review of Systems:   All 12 point review of symptoms completed. Pertinent positives identified in the HPI, all other review of symptoms negative as below.      Objective   PHYSICAL EXAM:     Filed Vitals:    08/03/12 1347   BP: 172/99   Pulse:    Temp:    Resp:     Weight: 144 lb 4.8 oz (65.454 kg) (standing scale)       General Appearance:  Alert, cooperative, no distress, appears stated age, thin   Head:  Normocephalic, without obvious abnormality, atraumatic   Eyes:  PERRL, conjunctiva/corneas clear   Nose: Nares normal, no drainage or sinus tenderness   Throat: Lips, mucosa, and tongue normal   Neck: Supple, symmetrical, trachea midline, no adenopathy, thyroid: not enlarged, symmetric, no tenderness/mass/nodules, no carotid bruit or JVD   Lungs:   Clear to auscultation bilaterally, respirations unlabored   Chest Wall:  No tenderness or deformity   Heart:  Regular rate and rhythm, S1, S2 normal, no murmur, rub or gallop. + vigorous   Abdomen:   Soft, non-tender, bowel sounds active all four quadrants,  no masses, no organomegaly   Extremities: Extremities normal, atraumatic, no cyanosis or edema   Pulses: 2+ and symmetric   Skin: Skin color, texture, turgor normal, no rashes or lesions   Pysch: Normal mood and affect   Neurologic: Normal gross motor and sensory exam.         Labs   CBC: Lab Results   Component Value Date    WBC 6.9 08/03/2012    RBC 3.92 08/03/2012    HGB 11.6 08/03/2012    HCT 34.9 08/03/2012    MCV 89.0 08/03/2012    RDW 13.2 08/03/2012    PLT 222 08/03/2012     CMP:  Lab Results   Component Value Date    NA 133 08/03/2012    K 3.4 08/03/2012    CL 95 08/03/2012    CO2 25 08/03/2012    BUN 18 08/03/2012    CREATININE 0.8 08/03/2012    GFRAA >60 08/03/2012    GFRAA >60 06/26/2010    AGRATIO 2.4 08/03/2012    LABGLOM >60 08/03/2012    GLUCOSE 88 08/03/2012    PROT 6.1 08/03/2012    PROT 6.9 06/26/2010    CALCIUM 9.0 08/03/2012    BILITOT <0.2 08/03/2012    ALKPHOS 51 08/03/2012    AST 22 08/03/2012    ALT 23 08/03/2012     PT/INR:    No components found with this basename: PTPATIENT, PTINR     HgBA1c:  No results found for this basename: LABA1C     Lab Results   Component Value Date    TROPONINI <0.01  08/03/2012  Cardiac Data     Last EKG:  08/03/2012 NSR, LAE, no ischemia or infarct     Stress Test: 08/03/2012  Impression: Negative study. No scintigraphic evidence for ischemic heart disease.  Normal sinus rhythm.  2 mm ST horizontal depression with stress consistent with ischemia.      Assessment and Plan      1. Abnormal stress test: Personally reviewed. Normal perfusion but + ST depression on ECG but pt SBP >200 for parts of test and ECG are more consistent with Hypertensive response to exercise and not felt to be ischemic. Her story however IS concerning especially for decreased exercise tolerance and given age possible CAD as well as chronic NSAID use   ~ Would check echo   ~ Aggressive BP control   ~ Plan for cath next week. Cath could be outpt IF BP is controlled and pt able to take ASA given that her ECG and troponin are negative. If pt still inhouse, will plan for cath Monday.     2. HTN: sudden elevation with heavy USE of NSAIDS for back pain   ~ will check [place] for renal artery stenosis      Thank you for allowing [place] to participate in the care of [employer]. Please call me with any questions [phone].    Alicia Amel, MD, [facility]   Interventional Cardiologist  [facility]  [phone] [facility]  [phone] [place] Office  08/03/2012 3:38 PM    I will address the patient's cardiac risk factors and adjusted pharmacologic treatment as needed. In addition, I have reinforced the need for patient directed risk factor modification.  Tobacco use was discussed with the patient and educated on the negative effects and was asked not to use. All questions and concerns were addressed to the patient/family. Alternatives to my treatment were discussed. The note was completed using EMR. Every effort was made to ensure accuracy; however, inadvertent computerized transcription errors may be present.

## 2012-08-03 NOTE — ED Notes (Signed)
Pt taken to radiology     Christa See, RN  08/03/12 [phone]

## 2012-08-03 NOTE — ED Provider Notes (Signed)
[facility] EMERGENCY DEPARTMENT     CHIEF COMPLAINT  Hypertension; and Shoulder Pain      HISTORY OF PRESENT ILLNESS  Destiny Salinas is a 79 y.o. female with PMH of hypothyroidism, depression, hypertension who presents  to the ED with complaints of chest pain, left shoulder pain, and high blood pressure.  Shortly she came home from the operative night, she checked her blood pressure, which she does routinely throughout the day, and noticed that it was 199/110.  She took an extra lisinopril 10 mg, she normally takes 20 mg in the morning and had already taken her Toprol this morning, and rechecked about an hour later, her blood pressure increased to 200/110.  She describes a left upper chest aching that radiates into her shoulder, she did have a headache, no vision changes.  The symptoms have improved as her blood pressure is improved, her most recent blood pressure is 150/75.  She has taken a baby aspirin today.  At this time she has minimal pain remaining.    No other complaints, modifying factors or associated symptoms.     Nursing notes reviewed.   Past Medical History   Diagnosis Date   ??? Thyroid disease      hypothyroidism   ??? Psychiatric problem      depression   ??? Hypertension    ??? Urinary frequency      Past Surgical History   Procedure Laterality Date   ??? Fracture surgery       Rt ankle with ORIF w/ 3 pins   ??? Knee arthroscopy       right knee torn meniscus   ??? Appendectomy     ??? Tonsillectomy     ??? Colonoscopy  2006   ??? Eye surgery       bil. cataracts removed w/ lens implants   ??? Hysterectomy       History reviewed. No pertinent family history.  History     Social History   ??? Marital Status: Widowed     Spouse Name: N/A     Number of Children: N/A   ??? Years of Education: N/A     Occupational History   ??? Not on file.     Social History Main Topics   ??? Smoking status: Never Smoker    ??? Smokeless tobacco: Not on file   ??? Alcohol Use: 8.4 oz/week     14 Glasses of wine per week   ??? Drug Use:  No   ??? Sexual Activity: Not on file     Other Topics Concern   ??? Not on file     Social History Narrative     Prior to Admission medications    Medication Sig Start Date End Date Taking? Authorizing Provider   lisinopril (PRINIVIL;ZESTRIL) 20 MG tablet Take 20 mg by mouth daily.   Yes Historical Provider, MD   metoprolol (TOPROL XL) 25 MG XL tablet Take 25 mg by mouth 2 times daily.   Yes Historical Provider, MD   ibuprofen (ADVIL;MOTRIN) 200 MG tablet Take 600 mg by mouth every 6 hours as needed.   Yes Historical Provider, MD   oxybutynin (DITROPAN-XL) 10 MG CR tablet Take 10 mg by mouth daily.   Yes Historical Provider, MD   clonazePAM (KLONOPIN) 0.5 MG tablet Take 0.5 mg by mouth 2 times daily as needed.   Yes Historical Provider, MD   aspirin 81 MG chewable tablet Take 81 mg by mouth daily.  Yes Historical Provider, MD   traZODone (DESYREL) 50 MG tablet Take 50 mg by mouth nightly.   Yes Historical Provider, MD   levothyroxine (LEVOTHROID) 75 MCG tablet Take 1 tablet by mouth daily. 11/08/10  Yes Lawrence Santiago, MD     Allergies   Allergen Reactions   ??? Tape [place] Organ Tape]      And Tedhose       REVIEW OF SYSTEMS  Constitutional:  Denies fever, chills, sweats  HENT:  Denies nasal congestion or sore throat   Eyes:  Denies change in vision  Respiratory:  Denies cough or shortness of breath   Cardiovascular:  +chest pain denies edema   GI:  Denies abdominal pain, nausea, vomiting, diarrhea   GU:  Denies dysuria, difficulty urinating  Neurologic:  Denies headache, focal weakness or sensory changes   Musculoskeletal:  Denies back pain , joint pain   Psychiatric:  Denies depression or anxiety       PHYSICAL EXAM  BP 175/96   Pulse 66   Temp(Src) 97.6 ??F (36.4 ??C) (Oral)   Resp 16   Ht 5\' 7"  (1.702 m)   Wt 144 lb 4.8 oz (65.454 kg)   BMI 22.6 kg/m2   SpO2 98%  GENERAL APPEARANCE: Pleasant well-appearing not acutely distressed  HEAD: Normocephalic. Atraumatic.  EYES: EOM's grossly intact. Conjunctiva are WNLs  ENT:  Mucous membranes are moist.   NECK: Normal ROM.   CV: Equal symmetric chest rise.  Regular rate and rhythm    2+ distal pulses in all 4 extremities  LUNGS: Breathing is unlabored. Speaking comfortably in full sentences.  Clear to auscultation bilaterally  ABDOMEN: Normoactive bowel sounds.  Soft, nondistended, nontender  GU: deferred   EXTREMITIES: . Moving all 4 extremities spontaneously.  No acute deformities. All extremities neurovascularly intact.  SKIN: Warm and dry.    NEUROLOGICAL: Alert and oriented.  Grossly normal strength and power.    PSYCH: Mood and affect normal.      LABS  Results for orders placed during the hospital encounter of 08/03/12   TROPONIN       Result Value Range    Troponin <0.01  <0.01 ng/mL   CBC WITH AUTO DIFFERENTIAL       Result Value Range    WBC 6.9  4.0 - 11.0 K/uL    RBC 3.92 (*) 4.00 - 5.20 M/uL    Hemoglobin 11.6 (*) 12.0 - 16.0 g/dL    Hematocrit 09.8 (*) 36.0 - 48.0 %    MCV 89.0  80.0 - 100.0 fL    MCH 29.6  26.0 - 34.0 pg    MCHC 33.3  31.0 - 36.0 g/dL    RDW 11.9  14.7 - 82.9 %    Platelets 222  135 - 450 K/uL    MPV 9.2  5.0 - 10.5 fL    Neutrophils Relative 53.6      Lymphocytes Relative 32.1      Monocytes Relative 8.2      Eosinophils Relative Percent 4.4      Basophils Relative 1.7      Neutrophils Absolute 3.7  1.7 - 7.7 K/uL    Lymphocytes Absolute 2.2  1.0 - 5.1 K/uL    Monocytes Absolute 0.6  0.0 - 1.3 K/uL    Eosinophils Absolute 0.3  0.0 - 0.6 K/uL    Basophils Absolute 0.1  0.0 - 0.2 K/uL   COMPREHENSIVE METABOLIC PANEL       Result Value Range  Sodium 133 (*) 136 - 145 mmol/L    Potassium 3.4 (*) 3.5 - 5.1 mmol/L    Chloride 95 (*) 99 - 110 mmol/L    CO2 25  21 - 32 mmol/L    Glucose 88  70 - 99 mg/dL    BUN 18  7 - 20 mg/dL    CREATININE 0.8  0.6 - 1.2 mg/dL    GFR Non-African American >60  >60    GFR African American >60  >60    Calcium 9.0  8.3 - 10.6 mg/dL    Total Protein 6.1 (*) 6.4 - 8.2 g/dL    Alb 4.3  3.4 - 5.0 g/dL    Albumin/Globulin Ratio 2.4  (*) 1.1 - 2.2    Total Bilirubin <0.2  0.0 - 1.0 mg/dL    Alkaline Phosphatase 51  40 - 129 U/L    ALT 23  10 - 40 U/L    AST 22  15 - 37 U/L    Globulin 1.8     TROPONIN       Result Value Range    Troponin <0.01  <0.01 ng/mL   EKG 12-LEAD       Result Value Range    Ventricular Rate 67      Atrial Rate 67      P-R Interval 154      QRS Duration 100      Q-T Interval 386      QTc Calculation (Bazett) 407      P Axis 62      R Axis 10      T Axis 41      Diagnosis        Value: Normal sinus rhythmPossible Left atrial enlargementIncomplete right bundle branch blockNo previous ECGs availableConfirmed by GUPTA MD, RAKESH ([id number]) on 08/03/2012 7:01:11 AM        RADIOLOGY  X-RAYS: I have reviewed the films and provided a preliminary interpretation unless formal radiology read is provided    All non-plain film images such as CT, MRI, Ultrasound, have been read and interpreted by the Radiologist.    XR CHEST STANDARD TWO VW    (Results Pending)   NM MYOCARDIAL SPECT REST EXERCISE OR RX    (Results Pending)      Chest x-ray by my preliminary review reveals no acute infiltrate, no pneumothorax, mediastinum is within normal limits    EKG  The Ekg interpreted by me in the absence of a cardiologist shows.  Normal sinus rhythm at a rate of 67.  PR interval is 154, QRS is 100, QTC is 47.  Axis is normal.  There is incomplete right bundle-branch block.  There are nonspecific ST segment changes without acute ST segment elevation.  No old EKGs for comparison.       ED COURSE / MDM  Patient seen and evaluated. Labs and imaging reviewed and results discussed with patient    Destiny Salinas is a 79 y.o. female with chest pain, headache with accelerated hypertension.  Blood pressure has improved without any additional intervention from the emergency department however given her complaints, I do feel that she warrants cardiac evaluation.  She reports incidentally that she had a similar occurrence on Saturday, 5 days ago for which she was  instructed by her primary care to come to the hospital but did not.  Initial troponin and EKG are nonacute.    ED Treatments Administered-      Medications   aspirin chewable  tablet 243 mg (not administered)   nitroglycerin (NITRO-BID) 2 % ointment 0.5 inch (not administered)        Consults -   I spoke with Dr. Aura Dials. We thoroughly discussed the history, physical exam, laboratory and imaging studies, as well as, emergency department course. Based upon that discussion, we've decided to admit Destiny Salinas for further observation and evaluation of Destiny Salinas's chest pain.  As I have deemed necessary from their history, physical, and studies, I have considered and evaluated Destiny Salinas for the following diagnoses:  ACUTE CORONARY SYNDROME, PERICARDIAL TAMPONADE, PNEUMOTHORAX, PULMONARY EMBOLISM, and THORACIC DISSECTION.      CLINICAL IMPRESSION  1. Chest pain    2. Accelerated hypertension        Vitals:  Blood pressure 175/96, pulse 66, temperature 97.6 ??F (36.4 ??C), temperature source Oral, resp. rate 16, height 5\' 7"  (1.702 m), weight 144 lb 4.8 oz (65.454 kg), SpO2 98 %.    DISPOSITION - Patient was admitted in stable condition    Destiny Salinas was given scripts for the following medications. I counseled patient how to take these medications.   Current Discharge Medication List          Disclaimer: This chart was generated using the Dragon dictation system. I created this record but it may contain dictation errors.         Shiela Mayer, MD  08/03/12 [phone]

## 2012-08-03 NOTE — Progress Notes (Signed)
CASE MANAGEMENT - INITIAL    Met with patient today re: d/c planning needs/assessment. Explained our services.      Admit date: Today, OBS    Admitting Dx: Chest Pain     Living arrangements/Adls prior to admit: Pt lives alone. She states she is normally very independent. "I'm ready to go home."    DME:  None    In-home services:  None    Plan:  Pt plans to d/c to home. She denies d/c needs.

## 2012-08-03 NOTE — H&P (Signed)
Hospital Medicine History & Physical      PCP: Janalyn Harder, MD    Date of Admission: 08/03/2012    Date of Service: Pt seen/examined on 08/03/2012 and placed in Observation.    Chief Complaint:  HTN       History Of Present Illness:      The patient is a 79 y.o. female who presents to [facility] with elevated home BP monitoring.  Patient reported that she has been following her BP at home for the past 2 weeks.  She was last seen by her PCP Dr. Melida Gimenez at that time who was noting that her BP remained elevated.  She was instructed to monitor her BP BID ( AM and HS).  When she checked her BP last HS, she noted reading 199/110.  No associated symptoms at that time.  Denies CP, SOB, diaphoresis, or motor/sensory changes.  She took an extra Lisinopril 10 mg and drank a glass of wine thinking that she was stressed.  She rechecked her BP in 1 hour and noted BP 212/110.  Patient still was asymptomatic and was concerned that she could not lower her BP.  She drove herself to the ED for further medical evaluation.    Past Medical History:        Diagnosis Date   ??? Thyroid disease      hypothyroidism   ??? Psychiatric problem      depression   ??? Hypertension    ??? Urinary frequency        Past Surgical History:        Procedure Laterality Date   ??? Fracture surgery       Rt ankle with ORIF w/ 3 pins   ??? Knee arthroscopy       right knee torn meniscus   ??? Appendectomy     ??? Tonsillectomy     ??? Colonoscopy  2006   ??? Eye surgery       bil. cataracts removed w/ lens implants   ??? Hysterectomy         Medications Prior to Admission:    Prior to Admission medications    Medication Sig Start Date End Date Taking? Authorizing Provider   lisinopril (PRINIVIL;ZESTRIL) 20 MG tablet Take 20 mg by mouth daily.   Yes Historical Provider, MD   metoprolol (TOPROL XL) 25 MG XL tablet Take 25 mg by mouth 2 times daily.   Yes Historical Provider, MD   ibuprofen (ADVIL;MOTRIN) 200 MG tablet Take 600 mg by mouth every 6 hours as needed.    Yes Historical Provider, MD   oxybutynin (DITROPAN-XL) 10 MG CR tablet Take 10 mg by mouth daily.   Yes Historical Provider, MD   clonazePAM (KLONOPIN) 0.5 MG tablet Take 0.5 mg by mouth 2 times daily as needed.   Yes Historical Provider, MD   aspirin 81 MG chewable tablet Take 81 mg by mouth daily.   Yes Historical Provider, MD   traZODone (DESYREL) 50 MG tablet Take 50 mg by mouth nightly.   Yes Historical Provider, MD   levothyroxine (LEVOTHROID) 75 MCG tablet Take 1 tablet by mouth daily. 11/08/10  Yes Lawrence Santiago, MD       Allergies:  Tape    Social History:  The patient currently lives in [place] and is in [place] visiting her family.      TOBACCO:   reports that she has never smoked. She does not have any smokeless tobacco history on file.  ETOH:   reports that she drinks about 8.4 ounces of alcohol per week.      Family History:  Reviewed in detail and negative for DM, Early CAD, Cancer, CVA. Positive as follows:    History reviewed. No pertinent family history.    REVIEW OF SYSTEMS:   . All other systems reviewed and negative.    PHYSICAL EXAM:    BP 175/92   Pulse 72   Temp(Src) 97.6 ??F (36.4 ??C) (Oral)   Resp 16   Ht 5\' 7"  (1.702 m)   Wt 144 lb 4.8 oz (65.454 kg)   BMI 22.6 kg/m2   SpO2 98%    General appearance: No apparent distress appears stated age and cooperative.  HEENT Normal cephalic, atraumatic without obvious deformity.  Pupils equal, round, and reactive to light.  Extra ocular muscles intact.  Conjunctivae/corneas clear.  Neck: Supple, No jugular venous distention/bruits.  Trachea midline without thyromegaly or adenopathy with full range of motion.  Lungs: Clear to auscultation, bilaterally without Rales/Wheezes/Rhonchi with good respiratory effort.  Heart: Regular rate and rhythm with Normal S1/S2 without murmurs, rubs or gallops, point of maximum impulse non-displaced  Abdomen: Soft, non-tender or non-distended without rigidity or guarding and positive bowel sounds all four  quadrants.  Extremities: No clubbing, cyanosis, or edema bilaterally.  Full range of motion without deformity and normal gait intact.  Skin: Skin color, texture, turgor normal.  No rashes or lesions.  Neurologic: Alert and oriented X 3, neurovascularly intact with sensory/motor intact upper extremities/lower extremities, bilaterally.  Cranial nerves: II-XII intact, grossly non-focal.  Mental status: Alert, oriented, thought content appropriate.    CXR:  I have reviewed the CXR with the following interpretation:   IMPRESSION: No acute disease.      EKG:  I have reviewed the EKG with the following interpretation:     Normal sinus rhythm Possible Left atrial enlargement incomplete right bundle branch bloc  kNo previous ECGs availabl  eConfirmed by GUPTA MD, RAKESH ([id number]) on 08/03/2012 7:01:11 AM      CBC   Recent Labs      08/03/12   0202   WBC  6.9   HGB  11.6*   HCT  34.9*   PLT  222      RENAL  Recent Labs      08/03/12   0202   NA  133*   K  3.4*   CL  95*   CO2  25   BUN  18   CREATININE  0.8     LFT'S  Recent Labs      08/03/12   0202   AST  22   ALT  23   BILITOT  <0.2   ALKPHOS  51     COAG  No results found for this basename: INR,  in the last 72 hours  CARDIAC ENZYMES  Recent Labs      08/03/12   0202  08/03/12   0538  08/03/12   1048   TROPONINI  <0.01  <0.01  <0.01       U/A:    No results found for this basename: NITRITE,  COLORU,  WBCUA,  RBCUA,  MUCUS,  BACTERIA,  CLARITYU,  SPECGRAV,  LEUKOCYTESUR,  BLOODU,  GLUCOSEU,  KETONESU,  AMORPHOUS       ABG    No results found for this basename: HCO3ART,  BEART,  O2SATART,  PHART,  THGBART,  PCO2ART,  PO2ART,  TCO2ART  Active Hospital Problems    Diagnosis Date Noted   ??? Chest pain [786.50] 08/03/2012   ??? HTN (hypertension) [401.9] 08/03/2012   ??? Hypothyroid [244.9] 08/03/2012   ??? Hypokalemia [276.8] 08/03/2012         ASSESSMENT/PLAN:  Hypertensive Urgency:  Elevated BP not responding to her outpt medication regimen.  Will evaluate for secondary causes  of HTN.   Meds: Lisinopril, Metoprolol, Imdur, and Hydralazine.  Will keep Hydralazine IV prn.   Imaging: Renal U/S with flow imaging   Labs: Renin, Aldo, Cortisol,     Chest pain:  Clinical history not suggestive of typical exertional CP.  Troponins neg and no EKG changes at this time.  Underwent Ambulatory treadmill stress testing with 2 mm ST depression changes.  BP noted to be >200 systolic.  Patient noted "not feeling in her usual state of health" during that time period.  Cardiology consulted and felt that her ST changes are consistent with exercise adjustments and elevated BP.   Plan for BP control and Cardiac cath for evaluation   Serial troponins   EKG   Cards consulted    Hypothyroid: Stable and without secondary symptoms.   Maintain outpt thyroid repletion   Labs: TSH, Free T4    Hypokalemia:  Replete and serial labs      DVT Prophylaxis: Lovenox  Diet: DIET CARDIAC;  Code Status: Full Code  PT/OT Eval Status: No need identified    Dispo - Home after BP control and further cardiac evaluation.       Donne Hazel, MD    Thank you Janalyn Harder, MD for the opportunity to be involved in this patient's care. If you have any questions or concerns please feel free to contact me at [phone].

## 2012-08-03 NOTE — Progress Notes (Signed)
As per patient chart, patient has never smoked; tobacco cessation not indicated.

## 2012-08-03 NOTE — Progress Notes (Signed)
Patient admitted to room 205 bed 1 from ER.  Patient oriented to room, call light, bed rails, phone, lights and bathroom.  Patient instructed about the schedule of the day including: vital sign frequency, lab draws, possible tests, frequency of MD and staff rounds, including RN/MD rounding together at bedside, daily weights, and I &O's.  Patient instructed about prescribed diet, how to use , and television.  Telemetry box 95 in place, patient aware of placement and reason.  Bed locked, in lowest position, side rails up 2/4, call light within reach.  Will continue to monitor.

## 2012-08-04 LAB — EKG 12-LEAD
Atrial Rate: 67 {beats}/min
Atrial Rate: 68 {beats}/min
P Axis: 55 degrees
P Axis: 62 degrees
P-R Interval: 144 ms
P-R Interval: 154 ms
Q-T Interval: 386 ms
Q-T Interval: 400 ms
QRS Duration: 100 ms
QRS Duration: 92 ms
QTc Calculation (Bazett): 407 ms
QTc Calculation (Bazett): 425 ms
R Axis: 10 degrees
R Axis: 19 degrees
T Axis: 41 degrees
T Axis: 42 degrees
Ventricular Rate: 67 {beats}/min
Ventricular Rate: 68 {beats}/min

## 2012-08-04 LAB — CBC WITH AUTO DIFFERENTIAL
Basophils %: 0.7 %
Basophils Absolute: 0 10*3/uL (ref 0.0–0.2)
Eosinophils %: 4.2 %
Eosinophils Absolute: 0.3 10*3/uL (ref 0.0–0.6)
Hematocrit: 34.1 % — ABNORMAL LOW (ref 36.0–48.0)
Hemoglobin: 11.5 g/dL — ABNORMAL LOW (ref 12.0–16.0)
Lymphocytes %: 32.3 %
Lymphocytes Absolute: 1.9 10*3/uL (ref 1.0–5.1)
MCH: 30.5 pg (ref 26.0–34.0)
MCHC: 33.7 g/dL (ref 31.0–36.0)
MCV: 90.4 fL (ref 80.0–100.0)
MPV: 9.5 fL (ref 5.0–10.5)
Monocytes %: 9.8 %
Monocytes Absolute: 0.6 10*3/uL (ref 0.0–1.3)
Neutrophils %: 53 %
Neutrophils Absolute: 3.2 10*3/uL (ref 1.7–7.7)
Platelets: 210 10*3/uL (ref 135–450)
RBC: 3.77 M/uL — ABNORMAL LOW (ref 4.00–5.20)
RDW: 13.4 % (ref 12.4–15.4)
WBC: 6 10*3/uL (ref 4.0–11.0)

## 2012-08-04 LAB — BASIC METABOLIC PANEL
BUN: 12 mg/dL (ref 7–20)
CO2: 24 mmol/L (ref 21–32)
Calcium: 8.6 mg/dL (ref 8.3–10.6)
Chloride: 103 mmol/L (ref 99–110)
Creatinine: 0.7 mg/dL (ref 0.6–1.2)
GFR African American: >60 (ref >60)
GFR Non-African American: >60 (ref >60)
Glucose: 95 mg/dL (ref 70–99)
Potassium: 4.1 mmol/L (ref 3.5–5.1)
Sodium: 134 mmol/L — ABNORMAL LOW (ref 136–145)

## 2012-08-04 LAB — CORTISOL AM, TOTAL: Cortisol - AM: 7.9 ug/dL (ref 4.3–22.4)

## 2012-08-04 MED ADMIN — hydrALAZINE (APRESOLINE) tablet 50 mg: ORAL | @ 19:00:00 | NDC 62584073311

## 2012-08-04 MED ADMIN — sodium chloride flush 0.9 % injection 10 mL: INTRAVENOUS | @ 13:00:00

## 2012-08-04 MED ADMIN — oxybutynin (DITROPAN-XL) CR tablet 10 mg: ORAL | @ 13:00:00 | NDC 00378660501

## 2012-08-04 MED ADMIN — hydrALAZINE (APRESOLINE) tablet 50 mg: ORAL | @ 02:00:00 | NDC 62584073311

## 2012-08-04 MED ADMIN — traZODone (DESYREL) tablet 50 mg: ORAL | @ 02:00:00 | NDC 68084012411

## 2012-08-04 MED ADMIN — hydrALAZINE (APRESOLINE) tablet 50 mg: ORAL | @ 11:00:00 | NDC 62584073311

## 2012-08-04 MED ADMIN — enoxaparin (LOVENOX) injection 40 mg: SUBCUTANEOUS | @ 13:00:00 | NDC 00075801401

## 2012-08-04 MED ADMIN — metoprolol (TOPROL-XL) XL tablet 25 mg: ORAL | @ 13:00:00 | NDC 68084030311

## 2012-08-04 MED ADMIN — sodium chloride flush 0.9 % injection 10 mL: INTRAVENOUS | @ 02:00:00

## 2012-08-04 MED ADMIN — aspirin chewable tablet 81 mg: ORAL | @ 13:00:00 | NDC 66553000201

## 2012-08-04 MED ADMIN — lisinopril (PRINIVIL;ZESTRIL) tablet 40 mg: ORAL | @ 13:00:00 | NDC 68084019811

## 2012-08-04 MED ADMIN — isosorbide mononitrate (IMDUR) CR tablet 30 mg: ORAL | @ 13:00:00 | NDC 00143223001

## 2012-08-04 MED ADMIN — levothyroxine (SYNTHROID) tablet 75 mcg: ORAL | @ 11:00:00 | NDC 51079044001

## 2012-08-04 MED ADMIN — clonazePAM (KLONOPIN) tablet 0.5 mg: ORAL | @ 08:00:00 | NDC 63739026310

## 2012-08-04 MED ADMIN — metoprolol (TOPROL-XL) XL tablet 25 mg: ORAL | @ 02:00:00 | NDC 68084030311

## 2012-08-04 MED FILL — ACETAMINOPHEN 325 MG PO TABS: 325 MG | ORAL | Qty: 2

## 2012-08-04 MED FILL — TOPROL XL 25 MG PO TB24: 25 MG | ORAL | Qty: 1

## 2012-08-04 MED FILL — LOVENOX 40 MG/0.4ML SC SOLN: 40 MG/0.4ML | SUBCUTANEOUS | Qty: 0.4

## 2012-08-04 MED FILL — HYDRALAZINE HCL 25 MG PO TABS: 25 MG | ORAL | Qty: 2

## 2012-08-04 MED FILL — TRAZODONE HCL 50 MG PO TABS: 50 MG | ORAL | Qty: 1

## 2012-08-04 MED FILL — ISOSORBIDE MONONITRATE ER 30 MG PO TB24: 30 MG | ORAL | Qty: 1

## 2012-08-04 MED FILL — OXYBUTYNIN CHLORIDE ER 5 MG PO TB24: 5 MG | ORAL | Qty: 2

## 2012-08-04 MED FILL — ASPIRIN LOW STRENGTH 81 MG PO CHEW: 81 MG | ORAL | Qty: 1

## 2012-08-04 MED FILL — CLONAZEPAM 0.5 MG PO TABS: 0.5 MG | ORAL | Qty: 1

## 2012-08-04 MED FILL — LISINOPRIL 20 MG PO TABS: 20 MG | ORAL | Qty: 2

## 2012-08-04 MED FILL — LEVOTHYROXINE SODIUM 25 MCG PO TABS: 25 MCG | ORAL | Qty: 1

## 2012-08-04 MED FILL — NORMAL SALINE FLUSH 0.9 % IV SOLN: 0.9 % | INTRAVENOUS | Qty: 10

## 2012-08-04 NOTE — Progress Notes (Signed)
Pt c/o feeling "anxious" and requested her PRN dose of clonazepam.  Medication given.  Will continue to monitor.

## 2012-08-04 NOTE — Progress Notes (Signed)
[facility]   Daily Progress Note      Admit Date:  08/03/2012    Subjective:  Ms. Nodal reports chest pain last night and again this am without exertion.  Is located in left lateral chest radiating to shoulder, left arm and left neck.  She has always ignored this in the past thinking it was anxiety.  It would resolve on own or with clonazepam.  It is not associated with shortness of breath or palpitations.  She admits to increasing fatigue with normal activities recently.  She also reports GERD type symptoms when she eats requiring her to eat slowly and in smaller amounts to avoid emesis.      Stress test was abnormal with ECG changes but felt likely related to hypertension.  However, cardiac cath was recommended for further delineation of coronary anatomy.      Objective:   BP 128/70   Pulse 59   Temp(Src) 97.9 ??F (36.6 ??C) (Oral)   Resp 16   Ht 5\' 7"  (1.702 m)   Wt 144 lb 4.8 oz (65.454 kg)   BMI 22.6 kg/m2   SpO2 98%    Intake/Output Summary (Last 24 hours) at 08/04/12 1130  Last data filed at 08/04/12 0700   Gross per 24 hour   Intake    890 ml   Output      0 ml   Net    890 ml       Physical Exam:  General:  Awake, alert, NAD  Skin:  Warm and dry  Neck:  JVD<8  Chest:  Clear to auscultation, no wheezes/rhonchi/rales  Cardiovascular:  RRR S1S2, no m/g/r/c  Abdomen:  Soft, nontender, +bowel sounds  Extremities:  no edema    Medications:   ??? aspirin  81 mg Oral Daily   ??? levothyroxine  75 mcg Oral QAM AC   ??? metoprolol  25 mg Oral BID   ??? oxybutynin  10 mg Oral Daily   ??? traZODone  50 mg Oral Nightly   ??? sodium chloride flush  10 mL Intravenous Q12H [facility]   ??? enoxaparin  40 mg Subcutaneous Daily   ??? regadenoson  0.4 mg Intravenous Once   ??? lisinopril  40 mg Oral Daily   ??? isosorbide mononitrate  30 mg Oral Daily   ??? hydrALAZINE  50 mg Oral Q8H SCH        clonazePAM, ibuprofen, sodium chloride flush, acetaminophen, magnesium hydroxide, ondansetron, oxyCODONE, oxyCODONE, morphine, morphine, nitroGLYCERIN,  hydrALAZINE    Lab Data:  CBC:   Recent Labs      08/03/12   0202  08/04/12   0542   WBC  6.9  6.0   HGB  11.6*  11.5*   PLT  222  210     BMP:  Recent Labs      08/03/12   0202  08/04/12   0542   NA  133*  134*   K  3.4*  4.1   CO2  25  24   BUN  18  12   CREATININE  0.8  0.7     LIVR:   Recent Labs      08/03/12   0202   AST  22   ALT  23     INR:  No results found for this basename: INR,  in the last 72 hours  APTT: No results found for this basename: APTT,  in the last 72 hours  BNP:  No results found  for this basename: BNP,  in the last 72 hours    Principal Problem:    Chest pain  Active Problems:    HTN (hypertension)    Hypothyroid    Hypokalemia     Plan:  Chest pain   -cardiac enzymes negative, abnormal stress   -repeat ECG without acute findings   -recurrent chest pain despite BP improved  HTN   -agree with increased lisinopril dose   -continue hydralazine and nitrates as well   -renal artery u/s cannot be completed till Monday   -check Echo    I favor keeping patient for further monitoring and evaluation on Monday.    Langley Gauss, NP

## 2012-08-04 NOTE — Progress Notes (Signed)
Transferred to 211  cmu notified.

## 2012-08-04 NOTE — Progress Notes (Signed)
Hospitalist Progress Note      PCP: Janalyn Harder, MD    Date of Admission: 08/03/2012    Chief Complaint on Admission: HTN, chest pain    Pt Seen/Examined and Chart Reviewed. Admitting dx hypertensive urgency    SUBJECTIVE: Feels "completely fine."  Blood pressure has been much better today and there has been no recurrent chest pain.      OBJECTIVE:     Allergies  Tape    Medications      Scheduled Meds:  ??? aspirin  81 mg Oral Daily   ??? levothyroxine  75 mcg Oral QAM AC   ??? metoprolol  25 mg Oral BID   ??? oxybutynin  10 mg Oral Daily   ??? traZODone  50 mg Oral Nightly   ??? sodium chloride flush  10 mL Intravenous Q12H [facility]   ??? enoxaparin  40 mg Subcutaneous Daily   ??? regadenoson  0.4 mg Intravenous Once   ??? lisinopril  40 mg Oral Daily   ??? isosorbide mononitrate  30 mg Oral Daily   ??? hydrALAZINE  50 mg Oral Q8H SCH       Infusions:       PRN Meds:  clonazePAM, ibuprofen, sodium chloride flush, acetaminophen, magnesium hydroxide, ondansetron, oxyCODONE, oxyCODONE, morphine, morphine, nitroGLYCERIN, hydrALAZINE    Vitals    TEMPERATURE:  Current - Temp: 97.8 ??F (36.6 ??C); Max - Temp  Avg: 97.8 ??F (36.6 ??C)  Min: 97.6 ??F (36.4 ??C)  Max: 98 ??F (36.7 ??C)  RESPIRATIONS RANGE: Resp  Avg: 15.7  Min: 14  Max: 16  PULSE RANGE: Pulse  Avg: 66  Min: 58  Max: 72  BLOOD PRESSURE RANGE:  Systolic (24hrs), Avg:139 mmHg, Min:121 mmHg, Max:175 mmHg  ; Diastolic (24hrs), Avg:77 mmHg, Min:69 mmHg, Max:92 mmHg    PULSE OXIMETRY RANGE: SpO2  Avg: 98.2 %  Min: 98 %  Max: 99 %  24HR INTAKE/OUTPUT:    Intake/Output Summary (Last 24 hours) at 08/04/12 1439  Last data filed at 08/04/12 0700   Gross per 24 hour   Intake    890 ml   Output      0 ml   Net    890 ml       Exam:  General appearance: Pleasant adult female in no distress.  Looks much younger than her stated age  HEENT PERRL, MMM  Neck: Supple, No jugular venous distention  Lungs: Clear to ascultation, bilaterally without Rales/Wheezes/Rhonchi with good respiratory  effort.  Heart: Regular rate and rhythm with Normal S1/S2 without  murmurs, rubs or gallops  Abdomen: Soft, non-tender & non-distended without rigidity or guarding and positive bowel sounds all four quadrants.  Extremities: No clubbing, cyanosis, or edema bilaterally. Normal gait observed  Skin: no rash  Neurologic: Alert and oriented X 3,  neurovascularly intact with sensory/motor intact upper extremities/lower extremities, bilaterally. Cranial nerves:II-XII intact, grossly non-focal.  Mental status: Alert, oriented, thought content appropriate.        Data    Recent Labs      08/03/12   0202  08/04/12   0542   WBC  6.9  6.0   HGB  11.6*  11.5*   HCT  34.9*  34.1*   PLT  222  210      Recent Labs      08/03/12   0202  08/04/12   0542   NA  133*  134*   K  3.4*  4.1  CL  95*  103   CO2  25  24   BUN  18  12   CREATININE  0.8  0.7     Recent Labs      08/03/12   0202   AST  22   ALT  23   BILITOT  <0.2   ALKPHOS  51       Recent Labs      08/03/12   0202  08/03/12   0538  08/03/12   1048   TROPONINI  <0.01  <0.01  <0.01       Consults:     IP CONSULT TO HOSPITALIST  IP CONSULT TO CARDIOLOGY    Active Hospital Problems    Diagnosis Date Noted   ??? Chest pain [786.50] 08/03/2012   ??? HTN (hypertension) [401.9] 08/03/2012   ??? Hypothyroid [244.9] 08/03/2012   ??? Hypokalemia [276.8] 08/03/2012         ASSESSMENT AND PLAN      1. Hypertensive urgency - presenting with chest pain primarily   May have been related to recent NSAID use   Significantly better today with the following medications:  Metoprolol 25mg  po bid (same as home dose), Lisinopril 40 mg po daily (home dose is 20mg  qday), Imdur 30mg  po daily (new) & Hydralazine 50mg  po q8h (new)   Renal artery doppler on Monday to r/o RAS   Given improved control, will continue to monitor on present regimen  2. Chest pain - resolved, ruled out for MI   Cardiology consult appreciated   Suspect is related to hypertensive urgency with stress test findings, however cannot r/o  underlying CAD   For LHC on 08/06/12  3. Hypokalemia - resolved  4. Hypothyroid - controlled; TSH at goal   Continue synthroid  5. Anemia - normocytic, suspect is chronic & likely related to HTN (chronic disease)   H&H stable    DVT Prophylaxis: Lovenox  Diet: DIET CARDIAC;  Code Status: Full Code    PT/OT Eval Status: not needed    Dispo -  Likely d/c home post-cath    Carmie Kanner, MD

## 2012-08-05 MED ADMIN — hydrALAZINE (APRESOLINE) tablet 50 mg: ORAL | @ 02:00:00 | NDC 62584073311

## 2012-08-05 MED ADMIN — levothyroxine (SYNTHROID) tablet 75 mcg: ORAL | @ 10:00:00 | NDC 00527134101

## 2012-08-05 MED ADMIN — clonazePAM (KLONOPIN) tablet 0.5 mg: ORAL | @ 02:00:00 | NDC 63739026310

## 2012-08-05 MED ADMIN — aspirin chewable tablet 81 mg: ORAL | @ 13:00:00 | NDC 66553000201

## 2012-08-05 MED ADMIN — isosorbide mononitrate (IMDUR) CR tablet 30 mg: ORAL | @ 13:00:00 | NDC 00143223001

## 2012-08-05 MED ADMIN — hydrALAZINE (APRESOLINE) tablet 50 mg: ORAL | @ 10:00:00 | NDC 62584073311

## 2012-08-05 MED ADMIN — traZODone (DESYREL) tablet 50 mg: ORAL | @ 02:00:00 | NDC 68084012411

## 2012-08-05 MED ADMIN — enoxaparin (LOVENOX) injection 40 mg: SUBCUTANEOUS | @ 13:00:00 | NDC 00075801401

## 2012-08-05 MED ADMIN — oxybutynin (DITROPAN-XL) CR tablet 10 mg: ORAL | @ 13:00:00 | NDC 00378660501

## 2012-08-05 MED ADMIN — metoprolol (TOPROL-XL) XL tablet 25 mg: ORAL | @ 02:00:00 | NDC 68084030311

## 2012-08-05 MED ADMIN — metoprolol (TOPROL-XL) XL tablet 25 mg: ORAL | @ 13:00:00 | NDC 68084030311

## 2012-08-05 MED ADMIN — hydrALAZINE (APRESOLINE) tablet 50 mg: ORAL | @ 19:00:00 | NDC 62584073311

## 2012-08-05 MED ADMIN — lisinopril (PRINIVIL;ZESTRIL) tablet 40 mg: ORAL | @ 13:00:00 | NDC 68084019811

## 2012-08-05 MED FILL — TRAZODONE HCL 50 MG PO TABS: 50 MG | ORAL | Qty: 1

## 2012-08-05 MED FILL — LISINOPRIL 20 MG PO TABS: 20 MG | ORAL | Qty: 2

## 2012-08-05 MED FILL — HYDRALAZINE HCL 25 MG PO TABS: 25 MG | ORAL | Qty: 2

## 2012-08-05 MED FILL — ISOSORBIDE MONONITRATE ER 30 MG PO TB24: 30 MG | ORAL | Qty: 1

## 2012-08-05 MED FILL — NORMAL SALINE FLUSH 0.9 % IV SOLN: 0.9 % | INTRAVENOUS | Qty: 10

## 2012-08-05 MED FILL — LOVENOX 40 MG/0.4ML SC SOLN: 40 MG/0.4ML | SUBCUTANEOUS | Qty: 0.4

## 2012-08-05 MED FILL — ASPIRIN LOW STRENGTH 81 MG PO CHEW: 81 MG | ORAL | Qty: 1

## 2012-08-05 MED FILL — CLONAZEPAM 0.5 MG PO TABS: 0.5 MG | ORAL | Qty: 1

## 2012-08-05 MED FILL — ACETAMINOPHEN 325 MG PO TABS: 325 MG | ORAL | Qty: 2

## 2012-08-05 MED FILL — LEVOTHYROXINE SODIUM 25 MCG PO TABS: 25 MCG | ORAL | Qty: 1

## 2012-08-05 MED FILL — TOPROL XL 25 MG PO TB24: 25 MG | ORAL | Qty: 1

## 2012-08-05 MED FILL — OXYBUTYNIN CHLORIDE ER 5 MG PO TB24: 5 MG | ORAL | Qty: 2

## 2012-08-05 NOTE — Plan of Care (Signed)
Problem: Tissue Perfusion - Cardiopulmonary, Altered  Goal: Hemodynamically stable  VSS. NSR per telemetry. Denies chest pain at this time.

## 2012-08-05 NOTE — Progress Notes (Signed)
[facility]   Daily Cardiovascular Progress Note    Admit Date: 08/04/2012    HPI: Destiny Salinas has no complaints today.    Medications/Labs all Reviewed:  Patient Active Problem List   Diagnosis   ??? Abnormal mammogram   ??? Menopausal state   ??? Wound, open, leg   ??? Edema   ??? Chest pain   ??? HTN (hypertension)   ??? Hypothyroid   ??? Hypokalemia   ??? Hypertensive urgency       Medications:    aspirin chewable tablet 81 mg Daily   clonazePAM (KLONOPIN) tablet 0.5 mg BID PRN   ibuprofen (ADVIL;MOTRIN) tablet 600 mg Q6H PRN   levothyroxine (SYNTHROID) tablet 75 mcg QAM AC   metoprolol (TOPROL-XL) XL tablet 25 mg BID   oxybutynin (DITROPAN-XL) CR tablet 10 mg Daily   traZODone (DESYREL) tablet 50 mg Nightly   sodium chloride flush 0.9 % injection 10 mL Q12H SCH   sodium chloride flush 0.9 % injection 10 mL PRN   acetaminophen (TYLENOL) tablet 650 mg Q4H PRN   magnesium hydroxide (MILK OF MAGNESIA) 400 MG/5ML suspension 30 mL Daily PRN   ondansetron (ZOFRAN) injection 4 mg Q6H PRN   enoxaparin (LOVENOX) injection 40 mg Daily   oxyCODONE (ROXICODONE) immediate release tablet 5 mg Q4H PRN   Or    oxyCODONE (ROXICODONE) immediate release tablet 10 mg Q4H PRN   morphine (PF) injection 2 mg Q2H PRN   Or    morphine (PF) injection 4 mg Q2H PRN   nitroGLYCERIN (NITROSTAT) SL tablet 0.4 mg Q5 Min PRN   regadenoson (LEXISCAN) injection SOLN 0.4 mg Once   hydrALAZINE (APRESOLINE) injection 10 mg Q6H PRN   lisinopril (PRINIVIL;ZESTRIL) tablet 40 mg Daily   isosorbide mononitrate (IMDUR) CR tablet 30 mg Daily   hydrALAZINE (APRESOLINE) tablet 50 mg Q8H [facility]       Labs:  Lab Results   Component Value Date    WBC 6.0 08/04/2012    HGB 11.5* 08/04/2012    HCT 34.1* 08/04/2012    MCV 90.4 08/04/2012    PLT 210 08/04/2012     Lab Results   Component Value Date    CREATININE 0.7 08/04/2012    BUN 12 08/04/2012    NA 134* 08/04/2012    K 4.1 08/04/2012    CL 103 08/04/2012    CO2 24 08/04/2012     No results found for this basename: INR,  PROTIME        Physical Examination:    BP 154/83   Pulse 65   Temp(Src) 97.6 ??F (36.4 ??C) (Oral)   Resp 16   Ht 5\' 7"  (1.702 m)   Wt 144 lb 4.8 oz (65.454 kg)   BMI 22.6 kg/m2   SpO2 99%     Intake/Output Summary (Last 24 hours) at 08/05/12 1102  Last data filed at 08/05/12 1000   Gross per 24 hour   Intake   1700 ml   Output   3200 ml   Net  -1500 ml     Wt Readings from Last 3 Encounters:   08/03/12 144 lb 4.8 oz (65.454 kg)   11/23/11 144 lb (65.318 kg)   11/03/11 145 lb (65.772 kg)         Respiratory:  ?? Resp Assessment: Normal respiratory effort  ?? Resp Auscultation: Clear to auscultation bilaterally   Cardiovascular:  ?? Auscultation: regular rhythm and normal rate, normal S1S2, no murmur, rub or gallop  ??  Palpation:  Nl PMI  ?? JVP:  normal  ?? Extremities: No Edema  Abdomen:  ?? Soft, non-tender  ?? Normal bowel sounds  Extremities:  ??  No Cyanosis or Clubbing  Neurological/Psychiatric:  ?? Oriented to time, place, and person  ?? Non-anxious  Skin Warm and dry    Assessment:    1. Abnormal stress EKG with normal MPI  2. Hypertension  3. Chest pain    Plan:  1. Left heart cath in the AM to define anatomy. Due to persistent CP in setting of normal blood pressure, would like to confirmation of CAD.  2. Pre-cath orders placed.     I had the opportunity to review the Destiny Salinas clinical presentation and the available pertinent data. With the patient's clinical risk factors and symptoms, there is a high pretest likelihood for CAD. I have asked Destiny Salinas to undergo cardiac angiography for further diagnostic testing.    The procedure was explained in depth. All questions and alternate treatment strategies were discussed. The patient agrees to proceed. They understand all the risks associated with the procedure, including myocardial infarction, stroke, death, vascular complications, and the possible need for emergent surgery.      All questions and concerns were addressed to the patient/family. Alternatives to  my treatment were discussed. The note was completed using EMR. Every effort was made to ensure accuracy; however, inadvertent computerized transcription errors may be present.    Erik Obey, MD, MBA, [facility], [facility]  [facility]  08/05/2012 11:02 AM

## 2012-08-05 NOTE — Progress Notes (Signed)
Hospitalist Progress Note      PCP: Janalyn Harder, MD    Date of Admission: 08/04/2012    Chief Complaint on Admission: HTN, chest pain    Pt Seen/Examined and Chart Reviewed. Admitting dx hypertensive urgency    SUBJECTIVE: No complaints.  Doing exercise in her room and feeling well.  Has had no further chest pain and BP remains mostly controlled    OBJECTIVE:     Allergies  Tape    Medications      Scheduled Meds:  ??? aspirin  81 mg Oral Daily   ??? levothyroxine  75 mcg Oral QAM AC   ??? metoprolol  25 mg Oral BID   ??? oxybutynin  10 mg Oral Daily   ??? traZODone  50 mg Oral Nightly   ??? sodium chloride flush  10 mL Intravenous Q12H [facility]   ??? enoxaparin  40 mg Subcutaneous Daily   ??? regadenoson  0.4 mg Intravenous Once   ??? lisinopril  40 mg Oral Daily   ??? isosorbide mononitrate  30 mg Oral Daily   ??? hydrALAZINE  50 mg Oral Q8H SCH       Infusions:       PRN Meds:  clonazePAM, ibuprofen, sodium chloride flush, acetaminophen, magnesium hydroxide, ondansetron, oxyCODONE, oxyCODONE, morphine, morphine, nitroGLYCERIN, hydrALAZINE    Vitals    TEMPERATURE:  Current - Temp: 98.2 ??F (36.8 ??C); Max - Temp  Avg: 98.1 ??F (36.7 ??C)  Min: 97.6 ??F (36.4 ??C)  Max: 98.3 ??F (36.8 ??C)  RESPIRATIONS RANGE: Resp  Avg: 15  Min: 14  Max: 16  PULSE RANGE: Pulse  Avg: 67.5  Min: 59  Max: 80  BLOOD PRESSURE RANGE:  Systolic (24hrs), Avg:141 mmHg, Min:125 mmHg, Max:157 mmHg  ; Diastolic (24hrs), Avg:74 mmHg, Min:59 mmHg, Max:83 mmHg    PULSE OXIMETRY RANGE: SpO2  Avg: 99 %  Min: 98 %  Max: 100 %  24HR INTAKE/OUTPUT:      Intake/Output Summary (Last 24 hours) at 08/05/12 1230  Last data filed at 08/05/12 1225   Gross per 24 hour   Intake   1700 ml   Output   4000 ml   Net  -2300 ml       Exam:  General appearance: Pleasant adult female in no distress.  Looks younger than her stated age  HEENT PERRL, MMM  Neck: Supple, No jugular venous distention  Lungs: Clear to ascultation, bilaterally without Rales/Wheezes/Rhonchi with good respiratory  effort.  Heart: Regular rate and rhythm with Normal S1/S2 without  murmurs, rubs or gallops  Abdomen: Soft, non-tender & non-distended without rigidity or guarding and positive bowel sounds all four quadrants.  Extremities: No clubbing, cyanosis, or edema bilaterally. Normal gait observed  Skin: no rash  Neurologic: Alert and oriented X 3,  neurovascularly intact with sensory/motor intact upper extremities/lower extremities, bilaterally. Cranial nerves:II-XII intact, grossly non-focal.  Mental status: Alert, oriented, thought content appropriate.        Data    Recent Labs      08/03/12   0202  08/04/12   0542   WBC  6.9  6.0   HGB  11.6*  11.5*   HCT  34.9*  34.1*   PLT  222  210      Recent Labs      08/03/12   0202  08/04/12   0542   NA  133*  134*   K  3.4*  4.1   CL  95*  103   CO2  25  24   BUN  18  12   CREATININE  0.8  0.7     Recent Labs      08/03/12   0202   AST  22   ALT  23   BILITOT  <0.2   ALKPHOS  51       Recent Labs      08/03/12   0202  08/03/12   0538  08/03/12   1048   TROPONINI  <0.01  <0.01  <0.01       Consults:     IP CONSULT TO HOSPITALIST  IP CONSULT TO CARDIOLOGY    Active Hospital Problems    Diagnosis Date Noted   ??? Hypertensive urgency [401.9] 08/04/2012   ??? Chest pain [786.50] 08/03/2012   ??? HTN (hypertension) [401.9] 08/03/2012   ??? Hypothyroid [244.9] 08/03/2012   ??? Hypokalemia [276.8] 08/03/2012         ASSESSMENT AND PLAN      1. Hypertensive urgency - presenting with chest pain primarily   May have been related to recent NSAID use   Remains under reasonably good control with the following medications:  Metoprolol 25mg  po bid (same as home dose), Lisinopril 40 mg po daily (home dose is 20mg  qday), Imdur 30mg  po daily (new) & Hydralazine 50mg  po q8h (new)   Renal artery doppler on Monday to r/o RAS  2. Chest pain - resolved, ruled out for MI   Cardiology consult appreciated   For LHC on 08/06/12  3. Hypokalemia - resolved; repeat BMP in a.m.  4. Hypothyroid - controlled; TSH at  goal   Continue synthroid  5. Anemia - normocytic, suspect is chronic & likely related to HTN (chronic disease)   H&H stable    DVT Prophylaxis: Lovenox  Diet: DIET CARDIAC;  Code Status: Full Code    PT/OT Eval Status: not needed    Dispo -  Likely d/c home post-cath on 7/28 if no PCI needed    Carmie Kanner, MD

## 2012-08-05 NOTE — Plan of Care (Signed)
Problem: Tissue Perfusion - Cardiopulmonary, Altered  Goal: Absence of angina  Denies both chest pain and shoulder pain. Patient will be NPO at midnight for Angiogram in AM.

## 2012-08-06 LAB — BASIC METABOLIC PANEL
BUN: 14 mg/dL (ref 7–20)
CO2: 25 mmol/L (ref 21–32)
Calcium: 8.4 mg/dL (ref 8.3–10.6)
Chloride: 103 mmol/L (ref 99–110)
Creatinine: 0.7 mg/dL (ref 0.6–1.2)
GFR African American: >60 (ref >60)
GFR Non-African American: >60 (ref >60)
Glucose: 91 mg/dL (ref 70–99)
Potassium: 4.1 mmol/L (ref 3.5–5.1)
Sodium: 135 mmol/L — ABNORMAL LOW (ref 136–145)

## 2012-08-06 LAB — CBC WITH AUTO DIFFERENTIAL
Basophils %: 1 %
Basophils Absolute: 0 10*3/uL (ref 0.0–0.2)
Eosinophils %: 5.8 %
Eosinophils Absolute: 0.3 10*3/uL (ref 0.0–0.6)
Hematocrit: 36.3 % (ref 36.0–48.0)
Hemoglobin: 12.4 g/dL (ref 12.0–16.0)
Lymphocytes %: 42.8 %
Lymphocytes Absolute: 2 10*3/uL (ref 1.0–5.1)
MCH: 30.6 pg (ref 26.0–34.0)
MCHC: 34.2 g/dL (ref 31.0–36.0)
MCV: 89.6 fL (ref 80.0–100.0)
MPV: 9.1 fL (ref 5.0–10.5)
Monocytes %: 11.7 %
Monocytes Absolute: 0.5 10*3/uL (ref 0.0–1.3)
Neutrophils %: 38.7 %
Neutrophils Absolute: 1.8 10*3/uL (ref 1.7–7.7)
Platelets: 233 10*3/uL (ref 135–450)
RBC: 4.05 M/uL (ref 4.00–5.20)
RDW: 13.3 % (ref 12.4–15.4)
WBC: 4.6 10*3/uL (ref 4.0–11.0)

## 2012-08-06 LAB — RENIN: Renin Activity: 0.1 ng/mL/hr

## 2012-08-06 LAB — PHOSPHORUS: Phosphorus: 3.8 mg/dL (ref 2.5–4.9)

## 2012-08-06 LAB — MAGNESIUM: Magnesium: 2.1 mg/dL (ref 1.80–2.40)

## 2012-08-06 MED ADMIN — hydrALAZINE (APRESOLINE) tablet 50 mg: ORAL | @ 10:00:00 | NDC 62584073311

## 2012-08-06 MED ADMIN — oxybutynin (DITROPAN-XL) CR tablet 10 mg: ORAL | @ 14:00:00 | NDC 00378660501

## 2012-08-06 MED ADMIN — prasugrel (EFFIENT) tablet 60 mg: ORAL | @ 22:00:00 | NDC 00002512301

## 2012-08-06 MED ADMIN — fentaNYL (SUBLIMAZE) injection 25 mcg: INTRAVENOUS | @ 21:00:00 | NDC 00641602701

## 2012-08-06 MED ADMIN — levothyroxine (SYNTHROID) tablet 75 mcg: ORAL | @ 10:00:00 | NDC 51079044001

## 2012-08-06 MED ADMIN — metoprolol (TOPROL-XL) XL tablet 25 mg: ORAL | @ 02:00:00 | NDC 68084030311

## 2012-08-06 MED ADMIN — traZODone (DESYREL) tablet 50 mg: ORAL | @ 02:00:00 | NDC 68084012411

## 2012-08-06 MED ADMIN — hydrALAZINE (APRESOLINE) tablet 50 mg: ORAL | @ 17:00:00 | NDC 62584073311

## 2012-08-06 MED ADMIN — isosorbide mononitrate (IMDUR) CR tablet 30 mg: ORAL | @ 14:00:00 | NDC 00143223001

## 2012-08-06 MED ADMIN — aspirin chewable tablet 81 mg: ORAL | @ 14:00:00 | NDC 66553000201

## 2012-08-06 MED ADMIN — fentaNYL (SUBLIMAZE) injection 25 mcg: INTRAVENOUS | @ 23:00:00 | NDC 09999990241

## 2012-08-06 MED ADMIN — heparin (porcine) injection 3,500 Units: INTRAVENOUS | @ 21:00:00 | NDC 25021040010

## 2012-08-06 MED ADMIN — lisinopril (PRINIVIL;ZESTRIL) tablet 40 mg: ORAL | @ 14:00:00 | NDC 68084019811

## 2012-08-06 MED ADMIN — sodium chloride flush 0.9 % injection 10 mL: INTRAVENOUS | @ 02:00:00

## 2012-08-06 MED ADMIN — clonazePAM (KLONOPIN) tablet 0.5 mg: ORAL | @ 02:00:00 | NDC 63739026310

## 2012-08-06 MED ADMIN — midazolam (VERSED) injection 2 mg: INTRAVENOUS | @ 20:00:00 | NDC 00409230517

## 2012-08-06 MED ADMIN — midazolam (VERSED) injection 1 mg: INTRAVENOUS | @ 21:00:00 | NDC 00409230517

## 2012-08-06 MED ADMIN — 0.9 % sodium chloride infusion: INTRAVENOUS | @ 02:00:00 | NDC 00338004904

## 2012-08-06 MED ADMIN — hydrALAZINE (APRESOLINE) injection 20 mg: INTRAVENOUS | @ 23:00:00 | NDC 00517090125

## 2012-08-06 MED ADMIN — heparin (porcine) injection 2,000 Units: INTRAVENOUS | @ 21:00:00 | NDC 25021040010

## 2012-08-06 MED ADMIN — midazolam (VERSED) injection 1 mg: INTRAVENOUS | @ 23:00:00 | NDC 10019002859

## 2012-08-06 MED ADMIN — fentaNYL (SUBLIMAZE) injection 25 mcg: INTRAVENOUS | @ 20:00:00 | NDC 00641602701

## 2012-08-06 MED ADMIN — metoprolol (TOPROL-XL) XL tablet 25 mg: ORAL | @ 14:00:00 | NDC 68084030311

## 2012-08-06 MED ADMIN — 0.9 % sodium chloride infusion: INTRAVENOUS | @ 14:00:00 | NDC 00338004904

## 2012-08-06 MED ADMIN — hydrALAZINE (APRESOLINE) tablet 50 mg: ORAL | @ 02:00:00 | NDC 62584073311

## 2012-08-06 MED ADMIN — heparin (porcine) injection 3,000 Units: INTRAVENOUS | @ 21:00:00 | NDC 25021040010

## 2012-08-06 MED FILL — SODIUM CHLORIDE 0.9 % IV SOLN: 0.9 % | INTRAVENOUS | Qty: 1000

## 2012-08-06 MED FILL — TRAZODONE HCL 50 MG PO TABS: 50 MG | ORAL | Qty: 1

## 2012-08-06 MED FILL — HYDRALAZINE HCL 20 MG/ML IJ SOLN: 20 MG/ML | INTRAMUSCULAR | Qty: 1

## 2012-08-06 MED FILL — VERAPAMIL HCL 2.5 MG/ML IV SOLN: 2.5 MG/ML | INTRAVENOUS | Qty: 2

## 2012-08-06 MED FILL — LEVOTHYROXINE SODIUM 25 MCG PO TABS: 25 MCG | ORAL | Qty: 1

## 2012-08-06 MED FILL — OXYBUTYNIN CHLORIDE ER 5 MG PO TB24: 5 MG | ORAL | Qty: 2

## 2012-08-06 MED FILL — LIDOCAINE HCL 2 % IJ SOLN: 2 % | INTRAMUSCULAR | Qty: 20

## 2012-08-06 MED FILL — HEPARIN SODIUM (PORCINE) 1000 UNIT/ML IJ SOLN: 1000 UNIT/ML | INTRAMUSCULAR | Qty: 10

## 2012-08-06 MED FILL — EFFIENT 10 MG PO TABS: 10 MG | ORAL | Qty: 6

## 2012-08-06 MED FILL — HYDRALAZINE HCL 25 MG PO TABS: 25 MG | ORAL | Qty: 2

## 2012-08-06 MED FILL — ISOSORBIDE MONONITRATE ER 30 MG PO TB24: 30 MG | ORAL | Qty: 1

## 2012-08-06 MED FILL — ADENOSCAN 3 MG/ML IV SOLN: 3 MG/ML | INTRAVENOUS | Qty: 20

## 2012-08-06 MED FILL — MIDAZOLAM HCL 2 MG/2ML IJ SOLN: 2 MG/ML | INTRAMUSCULAR | Qty: 2

## 2012-08-06 MED FILL — CLONAZEPAM 0.5 MG PO TABS: 0.5 MG | ORAL | Qty: 1

## 2012-08-06 MED FILL — FENTANYL CITRATE 0.05 MG/ML IJ SOLN: 0.05 MG/ML | INTRAMUSCULAR | Qty: 2

## 2012-08-06 MED FILL — TOPROL XL 25 MG PO TB24: 25 MG | ORAL | Qty: 1

## 2012-08-06 MED FILL — ASPIRIN LOW STRENGTH 81 MG PO CHEW: 81 MG | ORAL | Qty: 1

## 2012-08-06 MED FILL — NORMAL SALINE FLUSH 0.9 % IV SOLN: 0.9 % | INTRAVENOUS | Qty: 10

## 2012-08-06 MED FILL — IBUPROFEN 200 MG PO TABS: 200 MG | ORAL | Qty: 3

## 2012-08-06 MED FILL — SODIUM CHLORIDE 0.9 % IV SOLN: 0.9 % | INTRAVENOUS | Qty: 50

## 2012-08-06 MED FILL — ACETAMINOPHEN 325 MG PO TABS: 325 MG | ORAL | Qty: 2

## 2012-08-06 MED FILL — HEPARIN (PORCINE) IN NACL 2-0.9 UNIT/ML-% IJ SOLN: INTRAMUSCULAR | Qty: 500

## 2012-08-06 MED FILL — LISINOPRIL 20 MG PO TABS: 20 MG | ORAL | Qty: 2

## 2012-08-06 NOTE — Progress Notes (Signed)
Cath lab RNs brought pt back to room.  Bedside report recieved.  Right radial cath site assessed.  Bruising noted and marked on skin.  Pulses +2.  Fingers cool.  Right finger continuous pulse ox @ 98%.  Radial band in place with 6mL inflation placed slightly below site.  Thrombix pad place on actual site with old dried blood outlined.  Will continue to monitor.

## 2012-08-06 NOTE — Progress Notes (Signed)
Called by Rn for Rt radial hematoma. Pt with small wrist and radial band not able to apply direct pressure to pt radial artery and hematoma was forming. In addition tight Vasc band across her wrist was closing her ulner and and was turning blue. I removed TR back and held some occlusive and nonocclusive pressure over Rt wrist. Push out what i could of hematoma but wrist still appears "full". Sat pleph followed and showed excellent filling of both ulnar and radial arteries to distal hand (indivdually). Hand color return warm and pink. Vasc band reapplied by myelf without issues. Will follow her on floor with continous pulse ox.

## 2012-08-06 NOTE — Progress Notes (Signed)
Hospitalist Progress Note      PCP: Janalyn Harder, MD    Date of Admission: 08/04/2012    Chief Complaint on Admission: HTN, chest pain    Pt Seen/Examined and Chart Reviewed. Admitting dx hypertensive urgency    SUBJECTIVE: Pt resting in bed this AM, no acute issues or complaints, states that she has not had any chest pressure or discomfort this AM.    OBJECTIVE:     Allergies  Tape    Medications      Scheduled Meds:  ??? aspirin  81 mg Oral Daily   ??? levothyroxine  75 mcg Oral QAM AC   ??? metoprolol  25 mg Oral BID   ??? oxybutynin  10 mg Oral Daily   ??? traZODone  50 mg Oral Nightly   ??? sodium chloride flush  10 mL Intravenous Q12H [facility]   ??? enoxaparin  40 mg Subcutaneous Daily   ??? regadenoson  0.4 mg Intravenous Once   ??? lisinopril  40 mg Oral Daily   ??? isosorbide mononitrate  30 mg Oral Daily   ??? hydrALAZINE  50 mg Oral Q8H SCH       Infusions:  ??? sodium chloride 75 mL/hr at 08/05/12 2206       PRN Meds:  clonazePAM, ibuprofen, sodium chloride flush, acetaminophen, magnesium hydroxide, ondansetron, oxyCODONE, oxyCODONE, morphine, morphine, nitroGLYCERIN, hydrALAZINE    Vitals    TEMPERATURE:  Current - Temp: 98 ??F (36.7 ??C); Max - Temp  Avg: 98.1 ??F (36.7 ??C)  Min: 98 ??F (36.7 ??C)  Max: 98.2 ??F (36.8 ??C)  RESPIRATIONS RANGE: Resp  Avg: 14.5  Min: 14  Max: 16  PULSE RANGE: Pulse  Avg: 68.8  Min: 60  Max: 75  BLOOD PRESSURE RANGE:  Systolic (24hrs), Avg:139 mmHg, Min:135 mmHg, Max:141 mmHg  ; Diastolic (24hrs), Avg:75 mmHg, Min:59 mmHg, Max:89 mmHg    PULSE OXIMETRY RANGE: SpO2  Avg: 100 %  Min: 100 %  Max: 100 %  24HR INTAKE/OUTPUT:      Intake/Output Summary (Last 24 hours) at 08/06/12 0751  Last data filed at 08/06/12 0617   Gross per 24 hour   Intake 2113.75 ml   Output   5600 ml   Net -3486.25 ml       Exam:  General appearance: Pleasant adult female in no distress, appears younger than stated age, thin   HEENT PERRL, EOMI, MMM  Neck: Supple, no jugular venous distention  Lungs: Unlabored respiration,  clear to ascultation in all lung fields without Rales/Wheezes/Rhonchi  Heart: Regular rate and rhythm with normal S1/S2 without  murmurs, rubs or gallops  Abdomen: Thin, soft, non-tender & non-distended without rigidity or guarding and positive bowel sounds all four quadrants.  Extremities: No clubbing, cyanosis, or edema bilaterally.  Skin: Normal color, no rashes/lesions  Neurologic: Alert and oriented X 3,  neurovascularly intact with sensory/motor intact upper extremities/lower extremities, bilaterally. Cranial nerves:II-XII intact, grossly non-focal.  Mental status: Alert, oriented, thought content appropriate.    Data    Recent Labs      08/04/12   0542  08/06/12   0616   WBC  6.0  4.6   HGB  11.5*  12.4   HCT  34.1*  36.3   PLT  210  233      Recent Labs      08/04/12   0542  08/06/12   0616   NA  134*  135*   K  4.1  4.1  CL  103  103   CO2  24  25   PHOS   --   3.8   BUN  12  14   CREATININE  0.7  0.7     No results found for this basename: AST, ALT, ALB, BILIDIR, BILITOT, ALKPHOS,  in the last 72 hours    Recent Labs      08/03/12   1048   TROPONINI  <0.01       Consults:     IP CONSULT TO HOSPITALIST  IP CONSULT TO CARDIOLOGY    Active Hospital Problems    Diagnosis Date Noted   ??? Hypertensive urgency [401.9] 08/04/2012   ??? Chest pain [786.50] 08/03/2012   ??? HTN (hypertension) [401.9] 08/03/2012   ??? Hypothyroid [244.9] 08/03/2012   ??? Hypokalemia [276.8] 08/03/2012         ASSESSMENT AND PLAN      Hypertensive urgency - Initially presented with chest pain and discomfort, may have been related to recent NSAID use?  Ranged from 120-154/63-89 while on:  Metoprolol 25mg  po bid (same as home dose), Lisinopril 40 mg po daily (home dose is 20mg  qday), Imdur 30mg  po daily (new) & Hydralazine 50mg  po q8h (new).  Pt to undergo renal artery doppler today to evaluate for RAS, will follow-up on results.    Chest pain - resolved, ruled out for acute MI, cardiology consulted and following, recommendations appreciated,  plan is for LHC today to evaluate for underlying coronary disease and anatomy.     Hypokalemia - resolved; repeat BMP in a.m.    Hypothyroid - controlled; TSH at goal, continue on synthroid 75 mcg daily    Normocytic anemia - suspect is chronic & likely related to HTN (chronic disease); H&H has remained stable during stay, currently 12.4, will continue to monitor.    DVT Prophylaxis: Lovenox  Diet: Diet NPO Time Specified, Except for  Code Status: Full Code    PT/OT Eval Status: not needed    Dispo -  1-2 days pending LHC results.    Rennie Plowman, MD  Internal Medicine  Pager: [phone]

## 2012-08-06 NOTE — Procedures (Signed)
PATIENT NAME:                  PA #:             MR #[id number], BARNICK                 [id number]        [id number]           ATTENDING PHYSICIAN:                  SERVICE DATE: DIS DATE:         Lajuana Carry, MD              08/06/2012                      DATE OF BIRTH:    AGE:           PATIENT TYPE:      RM #:             [birth year]        79             IPA                ACC                  PROCEDURES PERFORMED:  1.  Bilateral coronary angiography via right radial artery approach.  2.  Left heart catheterization.  3.  Left ventriculogram.  4.  Fractional flow reserve evaluation of ostial left anterior descending  lesion.  5.  Successful percutaneous intervention with drug-eluting stent to mid left  anterior descending.     INDICATION FOR PROCEDURE:  The patient is a 79 year old female who was  admitted with complaints of chest pain.  She has a history of hypertension  and hypothyroidism.  She had a stress test that showed no ischemia by nuclear  perfusion but did have 2 mm of ST depression.  This was in the setting of  severe uncontrolled blood pressure with systolic greater than 200.  In the  hospital, she continued to have anginal type chest pains.  Therefore, she was  brought to the cardiac catheterization lab for evaluation of the above  findings.     DETAILS OF PROCEDURE:  After an Freida Busman test was performed by myself that  showed good patency of the radial and ulnar arteries, brought to the  catheterization lab in the fasting state where her right wrist was prepped  and draped in usual sterile fashion.  IV Versed and fentanyl for monitored  sedation.  Lidocaine for local anesthesia.  A 5-French sheath was inserted  using modified Seldinger technique without complication.  Tiger 4 catheter  was used to cross the aortic valve, measure LVEDP, perform a power injection,  and withdraw across the aortic valve.  The Tiger catheter was then directed  into the left main and the right coronary  artery.  Multiple angiographic  views were obtained.  After that, reviewed the pictures, showed patient had  an ostial lesion that appeared to be somewhat suspicious and borderline in  nature.  She also had a mid LAD lesion that did appear to be tight.   Therefore we decided to intervene and evaluate this further.  At that point,  the 5-French sheath was switched out for a 6-French sheath.  An additional  intraarterial cocktail of verapamil and nitroglycerin was administered and  additional heparin was given and ACTs were checked throughout the case to  ensure that they were satisfactory.  At that point, an FFR wire was zero  balanced, calibrated, and normalized.  It was then sent down into the LAD and  into the diagonal vessel and the guide was disengaged.  The patient had  already had nitroglycerin and the lesion did not respond to this.  At that  point, I went ahead and did FFR via systolic adenosine.  FFR baseline was  0.98, at maximal hyperemia 0.91, indicating the ostial LAD stenosis was not  ischemic in nature.  At that point, I went ahead and removed the FFR wire and  the sheath.  I upsized to my 6-French sheath and used an EBU 4 guide to  engage the left main coronary artery.  I then took a run-through wire,  navigated down the LAD proper.  I then took a BMW wire and navigated into the  diagonal for protection.  At that point, I predilated the mid LAD using a  2.25 x 15 noncompliant Trek, followed this up with a Xience Xpedition 3.5 x  23 mm drug-eluting stent, placing this in the mid LAD across the diagonal  bifurcation.  This was inflated to subatmospheric pressure.  I then took the  wire out of the diagonal branch and rewired through the stent struts into the  diagonal once again.  I then opened up the stent struts in this side branch  using a 2.25 x 15 mm noncompliant Trek.  I then did final kissing balloon  inflations using two 3.0 x 15 mm noncompliant Trek and a kissing inflation to  nominal pressure.   Subsequent angiography showed that the lesion appeared to  be well covered.  There was excellent coverage of the carina.  There was no  perforation or dissection with TIMI 3 flow distally.  The patient tolerated  the procedure well with no immediate complications.      ANGIOGRAPHIC FINDINGS:  1.  Left main coronary artery has no angiographic disease, bifurcates into  the LAD and left circumflex.  2.  The LAD had an ostial 50% lesion.  This was checked by FFR and found to  be negative at 0.91.  The LAD itself was a very large vessel.  On the  midsection, there was an area of ectatic disease and lesions up to 80%.  The  remainder of the LAD had luminal irregularities.  There was a large diagonal  vessel coming off the mid LAD just before that section of disease.  This was  the lesion that was stented across.  3.  Left circumflex had a small vessel that continued in the AV groove.   There was a very large obtuse marginal 1 branch that came off the side.  4.  The right coronary artery was a large, dominant vessel with luminal  irregularities.   5.  LVEDP was about 15.  6.  Left ventricular ejection fraction was 65%.   7.  No aortic stenosis.     ASSESSMENT:  Significant stenosis of the mid left anterior descending, status  post treatment with Xience Xpedition 3.5 x 23 mm drug-eluting stent with  kissing balloon inflations in a provisional fashion to the side branch  diagonal vessel.  There is 0% residual stenosis, TIMI 3 flow.      PLAN:    1.  At this time, patient will continue overnight  admission for observation  following intervention.  I will load her with Effient, continue on 10 mg  daily.  The patient is at the weight limit for Effient, so will have to watch  her carefully to ensure that she does not need 5 mg daily instead.  Will  continue Effient for a minimum of 1 year.   2.  Aspirin 81 mg daily for life.  3.  Aggressive risk factor reduction and medical management.                                              Devonne Doughty, MD     [id number]  DD: 08/06/2012 17:58   DT: 08/07/2012 04:55   Job #: [id number]  CC: Lajuana Carry, MD  CC: Irena Reichmann, MD

## 2012-08-06 NOTE — Brief Op Note (Signed)
Brief Postoperative Note    DETRICE CALES  Date of Birth:  [birth year]  [id number]    Pre-operative Diagnosis: Angina  Post-operative Diagnosis: Same  Procedure: LHC  Anesthesia: Moderate Sedation  Surgeons/Assistants: Clell Trahan  Estimated Blood Loss: less than 50   Complications: None  Specimens: Was Not Obtained    Findings:   (Dictation: [id number])  LM: No angiographic CAD  LAD: ostial 50% (FFR negative 0.98->0.91), at mid lad just past large diag there was small section of diffuse disease up to 80%. Just before large diagonal vessel with no disease.   LCX: small vessel with large OM1  RCA: Large dominant, luminal irregularities.     LVEDP  LVEF: 65%    PCI of mid LAD  Predilat with 2.25 x 15mm NC trek  Wired diag  Xience 3.5 x33mm  Rewired diag and opend side branch struts with 2.25 x 15mm NC trek  Environmental consultant with 3 x66mm NC Trek      PLAN.   1. ASA daily, Effient 10mg  qday for 1 yr (at cutoff dose for 5mg , if looses weight will need to adjust)  2. Medical managment    Electronically signed by Harvie Bridge, MD on 08/06/2012 at 5:42 PM

## 2012-08-06 NOTE — Procedures (Signed)
Patient:  Destiny Salinas   DOB:   Jun 18, 1932      H&P Update  I have reviewed the history and physical and examined the patient and find no relevant changes. I have reviewed with the patient and/or family the risks, benefits, and alternatives to the procedure.      Pre-sedation Assessment  Intended Procedure: Left heart Catheterization    [facility] nurses notes reviewed and agreed.  Medications reviewed  Allergies:   Allergies   Allergen Reactions   ??? Tape [place] Organ Tape]      And Tedhose         Pre-Procedure Assessment/Plan:  ASA 3 - Patient with moderate systemic disease with functional limitations    Level of Sedation Plan:Moderate sedation    Post Procedure plan: Return to same level of care

## 2012-08-06 NOTE — Progress Notes (Signed)
Pt c/o pain in head, 3/10.  Tylenol given. Lolita RiegerAdrienne N Jamya Starry  r

## 2012-08-06 NOTE — Progress Notes (Signed)
Tele box 7 placed on pt and verified with [facility] - continuous pulse ox initiated. Other tele box returned to [facility] in [place].

## 2012-08-06 NOTE — Progress Notes (Signed)
[facility]   Daily Cardiovascular Progress Note    Admit Date: 08/05/2012    HPI: Doing well, had some chest discomfort overnight.       Medications/Labs all Reviewed:  Patient Active Problem List   Diagnosis   ??? Abnormal mammogram   ??? Menopausal state   ??? Wound, open, leg   ??? Edema   ??? Chest pain   ??? HTN (hypertension)   ??? Hypothyroid   ??? Hypokalemia   ??? Hypertensive urgency       Medications:    0.9 % sodium chloride infusion Continuous   aspirin chewable tablet 81 mg Daily   clonazePAM (KLONOPIN) tablet 0.5 mg BID PRN   ibuprofen (ADVIL;MOTRIN) tablet 600 mg Q6H PRN   levothyroxine (SYNTHROID) tablet 75 mcg QAM AC   metoprolol (TOPROL-XL) XL tablet 25 mg BID   oxybutynin (DITROPAN-XL) CR tablet 10 mg Daily   traZODone (DESYREL) tablet 50 mg Nightly   sodium chloride flush 0.9 % injection 10 mL Q12H SCH   sodium chloride flush 0.9 % injection 10 mL PRN   acetaminophen (TYLENOL) tablet 650 mg Q4H PRN   magnesium hydroxide (MILK OF MAGNESIA) 400 MG/5ML suspension 30 mL Daily PRN   ondansetron (ZOFRAN) injection 4 mg Q6H PRN   enoxaparin (LOVENOX) injection 40 mg Daily   oxyCODONE (ROXICODONE) immediate release tablet 5 mg Q4H PRN   Or    oxyCODONE (ROXICODONE) immediate release tablet 10 mg Q4H PRN   morphine (PF) injection 2 mg Q2H PRN   Or    morphine (PF) injection 4 mg Q2H PRN   nitroGLYCERIN (NITROSTAT) SL tablet 0.4 mg Q5 Min PRN   regadenoson (LEXISCAN) injection SOLN 0.4 mg Once   hydrALAZINE (APRESOLINE) injection 10 mg Q6H PRN   lisinopril (PRINIVIL;ZESTRIL) tablet 40 mg Daily   isosorbide mononitrate (IMDUR) CR tablet 30 mg Daily   hydrALAZINE (APRESOLINE) tablet 50 mg Q8H SCH          PHYSICAL EXAM   BP 135/70   Pulse 60   Temp(Src) 98 ??F (36.7 ??C) (Oral)   Resp 14   Ht 5\' 7"  (1.702 m)   Wt 144 lb 4.8 oz (65.454 kg)   BMI 22.6 kg/m2   SpO2 100%   Filed Vitals:    08/05/12 1225 08/05/12 2049 08/05/12 2300 08/06/12 0500   BP: 139/59 141/83 140/89 135/70   Pulse: 66 74 75 60   Temp: 98.2 ??F  (36.8 ??C) 98.2 ??F (36.8 ??C) 98.1 ??F (36.7 ??C) 98 ??F (36.7 ??C)   TempSrc: Oral Oral     Resp: 16 14 14 14    Height:       Weight:       SpO2: 100% 100% 100% 100%         Intake/Output Summary (Last 24 hours) at 08/06/12 0856  Last data filed at 08/06/12 0617   Gross per 24 hour   Intake 2113.75 ml   Output   5600 ml   Net -3486.25 ml     Wt Readings from Last 3 Encounters:   08/03/12 144 lb 4.8 oz (65.454 kg)   11/23/11 144 lb (65.318 kg)   11/03/11 145 lb (65.772 kg)       Gen: Patient in NAD, resting comfortably  Neck: No JVD or bruits  Respiratory: CTAB no WRR  Chest: normal without deformity  Cardiovascular:RRR, S1S2, no mrg, normal PMI  Abdomen: Soft, NTND, Normal BS  Extremities: No clubbing, cyanosis, or edema  Neurological/Psychiatric: AxO  x4, No gross motors/sensory deficits  Skin:  Warm and dry      Labs:  CBC: Recent Labs      08/04/12   0542  08/06/12   0616   WBC  6.0  4.6   HGB  11.5*  12.4   HCT  34.1*  36.3   MCV  90.4  89.6   PLT  210  233     BMP: Recent Labs      08/04/12   0542  08/06/12   0616   NA  134*  135*   K  4.1  4.1   CL  103  103   CO2  24  25   PHOS   --   3.8   BUN  12  14   CREATININE  0.7  0.7     MG:    Recent Labs      08/06/12   0616   MG  2.10      PT/INR: No results found for this basename: PROTIME, INR,  in the last 72 hours  APTT: No results found for this basename: APTT,  in the last 72 hours  Cardiac Enzymes: Recent Labs      08/03/12   1048   TROPONINI  <0.01       Cardiac Studies:    ECHO  Overall left ventricular function is normal. Ejection fraction is   Visually estimated to be 55-60 %.  Mild concentric left ventricular hypertrophy is present.  Aortic valve appears sclerotic but opens adequately.  Systolic pulmonary artery pressure (SPAP) is normal and estimated at 36   MmHg (RA pressure 3 mmHg).        Assessment and Plan     1. Abnormal stress test: Personally reviewed. Normal perfusion but + ST depression on ECG but pt SBP >200 for parts of test and ECG are more  consistent with Hypertensive response to exercise and not felt to be ischemic. Her story however IS concerning especially for decreased exercise tolerance and given age possible CAD as well as chronic NSAID use. Recurrent CP despite normal BP   ~ Cath today       2. HTN: Currently controlled   sudden elevation with heavy USE of NSAIDS for back pain    ~ [place] for renal artery stenosis today        Thank you for allowing me to participate in the care of your patient. Please call me with any questions [phone].      Alicia Amel, MD, [facility]   Interventional Cardiologist  [facility]  [phone] [facility]  [phone] [facility] Office  08/06/2012 8:56 AM

## 2012-08-06 NOTE — Progress Notes (Signed)
Pt was never a smoker.

## 2012-08-06 NOTE — Progress Notes (Signed)
Pt feeling anxious and requesting PRN clonazepam.  Medication given.  Will continue to monitor.

## 2012-08-07 LAB — BASIC METABOLIC PANEL
BUN: 8 mg/dL (ref 7–20)
CO2: 24 mmol/L (ref 21–32)
Calcium: 8.9 mg/dL (ref 8.3–10.6)
Chloride: 98 mmol/L — ABNORMAL LOW (ref 99–110)
Creatinine: 0.6 mg/dL (ref 0.6–1.2)
GFR African American: >60 (ref >60)
GFR Non-African American: >60 (ref >60)
Glucose: 118 mg/dL — ABNORMAL HIGH (ref 70–99)
Potassium: 3.4 mmol/L — ABNORMAL LOW (ref 3.5–5.1)
Sodium: 131 mmol/L — ABNORMAL LOW (ref 136–145)

## 2012-08-07 LAB — CBC
Hematocrit: 35.1 % — ABNORMAL LOW (ref 36.0–48.0)
Hemoglobin: 11.6 g/dL — ABNORMAL LOW (ref 12.0–16.0)
MCH: 29.7 pg (ref 26.0–34.0)
MCHC: 33.2 g/dL (ref 31.0–36.0)
MCV: 89.5 fL (ref 80.0–100.0)
MPV: 9.3 fL (ref 5.0–10.5)
Platelets: 227 10*3/uL (ref 135–450)
RBC: 3.92 M/uL — ABNORMAL LOW (ref 4.00–5.20)
RDW: 13.1 % (ref 12.4–15.4)
WBC: 8.1 10*3/uL (ref 4.0–11.0)

## 2012-08-07 LAB — POCT ARTERIAL
ACT-C: 181 s
ACT-C: 217 s
ACT-C: 250 s

## 2012-08-07 LAB — ALDOSTERONE: Aldosterone: <1.6 ng/dL

## 2012-08-07 MED ADMIN — levothyroxine (SYNTHROID) tablet 75 mcg: ORAL | @ 11:00:00 | NDC 51079044001

## 2012-08-07 MED ADMIN — metoprolol (TOPROL-XL) XL tablet 25 mg: ORAL | @ 14:00:00 | NDC 68084030311

## 2012-08-07 MED ADMIN — potassium chloride SA (K-DUR;KLOR-CON M) tablet 40 mEq: ORAL | @ 18:00:00 | NDC 68084036011

## 2012-08-07 MED ADMIN — hydrALAZINE (APRESOLINE) tablet 50 mg: ORAL | @ 02:00:00 | NDC 62584073311

## 2012-08-07 MED ADMIN — hydrALAZINE (APRESOLINE) tablet 50 mg: ORAL | @ 20:00:00 | NDC 62584073311

## 2012-08-07 MED ADMIN — oxybutynin (DITROPAN-XL) CR tablet 10 mg: ORAL | @ 12:00:00 | NDC 00378660501

## 2012-08-07 MED ADMIN — hydrALAZINE (APRESOLINE) injection 10 mg: INTRAVENOUS | @ 08:00:00 | NDC 63323061401

## 2012-08-07 MED ADMIN — enoxaparin (LOVENOX) injection 40 mg: SUBCUTANEOUS | @ 12:00:00 | NDC 00075801401

## 2012-08-07 MED ADMIN — sodium chloride flush 0.9 % injection 10 mL: INTRAVENOUS | @ 12:00:00

## 2012-08-07 MED ADMIN — lisinopril (PRINIVIL;ZESTRIL) tablet 40 mg: ORAL | @ 12:00:00 | NDC 68084019811

## 2012-08-07 MED ADMIN — isosorbide mononitrate (IMDUR) CR tablet 30 mg: ORAL | @ 12:00:00 | NDC 00143223001

## 2012-08-07 MED ADMIN — aspirin chewable tablet 81 mg: ORAL | @ 12:00:00 | NDC 63739043401

## 2012-08-07 MED ADMIN — traZODone (DESYREL) tablet 50 mg: ORAL | @ 03:00:00 | NDC 68084012411

## 2012-08-07 MED ADMIN — sodium chloride flush 0.9 % injection 10 mL: INTRAVENOUS | @ 08:00:00

## 2012-08-07 MED ADMIN — sodium chloride flush 0.9 % injection 10 mL: INTRAVENOUS | @ 03:00:00

## 2012-08-07 MED ADMIN — metoprolol (TOPROL-XL) XL tablet 25 mg: ORAL | @ 12:00:00 | NDC 68084030311

## 2012-08-07 MED ADMIN — hydrALAZINE (APRESOLINE) tablet 50 mg: ORAL | @ 11:00:00 | NDC 62584073311

## 2012-08-07 MED ADMIN — clonazePAM (KLONOPIN) tablet 0.5 mg: ORAL | @ 03:00:00 | NDC 63739026310

## 2012-08-07 MED ADMIN — prasugrel (EFFIENT) tablet 10 mg: ORAL | @ 12:00:00 | NDC 00002512301

## 2012-08-07 MED FILL — TOPROL XL 25 MG PO TB24: 25 mg | ORAL | Qty: 1

## 2012-08-07 MED FILL — CLONAZEPAM 0.5 MG PO TABS: 0.5 MG | ORAL | Qty: 1

## 2012-08-07 MED FILL — OXYBUTYNIN CHLORIDE ER 5 MG PO TB24: 5 MG | ORAL | Qty: 2

## 2012-08-07 MED FILL — TRAZODONE HCL 50 MG PO TABS: 50 MG | ORAL | Qty: 1

## 2012-08-07 MED FILL — HYDRALAZINE HCL 25 MG PO TABS: 25 MG | ORAL | Qty: 2

## 2012-08-07 MED FILL — ISOSORBIDE MONONITRATE ER 30 MG PO TB24: 30 MG | ORAL | Qty: 1

## 2012-08-07 MED FILL — POTASSIUM CHLORIDE CRYS ER 20 MEQ PO TBCR: 20 MEQ | ORAL | Qty: 2

## 2012-08-07 MED FILL — NORMAL SALINE FLUSH 0.9 % IV SOLN: 0.9 % | INTRAVENOUS | Qty: 10

## 2012-08-07 MED FILL — IBUPROFEN 600 MG PO TABS: 600 MG | ORAL | Qty: 1

## 2012-08-07 MED FILL — LOVENOX 40 MG/0.4ML SC SOLN: 40 MG/0.4ML | SUBCUTANEOUS | Qty: 0.4

## 2012-08-07 MED FILL — TOPROL XL 25 MG PO TB24: 25 MG | ORAL | Qty: 1

## 2012-08-07 MED FILL — ASPIRIN LOW STRENGTH 81 MG PO CHEW: 81 MG | ORAL | Qty: 1

## 2012-08-07 MED FILL — HYDRALAZINE HCL 20 MG/ML IJ SOLN: 20 MG/ML | INTRAMUSCULAR | Qty: 1

## 2012-08-07 MED FILL — LEVOTHYROXINE SODIUM 25 MCG PO TABS: 25 MCG | ORAL | Qty: 1

## 2012-08-07 MED FILL — LISINOPRIL 20 MG PO TABS: 20 MG | ORAL | Qty: 2

## 2012-08-07 MED FILL — EFFIENT 10 MG PO TABS: 10 mg | ORAL | Qty: 1

## 2012-08-07 NOTE — Progress Notes (Signed)
Pt states her head "feels a lot better."  BP 129/54   Pulse 77   Temp(Src) 97.7 ??F (36.5 ??C) (Oral)   Resp 16   Ht 5\' 7"  (1.702 m)   Wt 144 lb 4.8 oz (65.454 kg)   BMI 22.6 kg/m2   SpO2 99% on RA.  Will continue to monitor.

## 2012-08-07 NOTE — Progress Notes (Addendum)
[facility]   Daily Cardiovascular Progress Note    Admit Date: 08/05/2012    HPI: Doing well, No Cp      Medications/Labs all Reviewed:  Patient Active Problem List   Diagnosis   ??? Abnormal mammogram   ??? Menopausal state   ??? Wound, open, leg   ??? Edema   ??? Chest pain   ??? HTN (hypertension)   ??? Hypothyroid   ??? Hypokalemia   ??? Hypertensive urgency       Medications:    metoprolol (TOPROL-XL) XL tablet 25 mg Once   [START ON 08/08/2012] metoprolol (TOPROL-XL) XL tablet 50 mg Daily   ondansetron (ZOFRAN) injection 4 mg Q6H PRN   prasugrel (EFFIENT) tablet 10 mg Daily   prasugrel (EFFIENT) tablet 60 mg Once   0.9 % sodium chloride infusion Continuous   aspirin chewable tablet 81 mg Daily   clonazePAM (KLONOPIN) tablet 0.5 mg BID PRN   ibuprofen (ADVIL;MOTRIN) tablet 600 mg Q6H PRN   levothyroxine (SYNTHROID) tablet 75 mcg QAM AC   oxybutynin (DITROPAN-XL) CR tablet 10 mg Daily   traZODone (DESYREL) tablet 50 mg Nightly   sodium chloride flush 0.9 % injection 10 mL Q12H SCH   sodium chloride flush 0.9 % injection 10 mL PRN   acetaminophen (TYLENOL) tablet 650 mg Q4H PRN   magnesium hydroxide (MILK OF MAGNESIA) 400 MG/5ML suspension 30 mL Daily PRN   enoxaparin (LOVENOX) injection 40 mg Daily   oxyCODONE (ROXICODONE) immediate release tablet 5 mg Q4H PRN   Or    oxyCODONE (ROXICODONE) immediate release tablet 10 mg Q4H PRN   morphine (PF) injection 2 mg Q2H PRN   Or    morphine (PF) injection 4 mg Q2H PRN   nitroGLYCERIN (NITROSTAT) SL tablet 0.4 mg Q5 Min PRN   regadenoson (LEXISCAN) injection SOLN 0.4 mg Once   hydrALAZINE (APRESOLINE) injection 10 mg Q6H PRN   lisinopril (PRINIVIL;ZESTRIL) tablet 40 mg Daily   isosorbide mononitrate (IMDUR) CR tablet 30 mg Daily   hydrALAZINE (APRESOLINE) tablet 50 mg Q8H SCH          PHYSICAL EXAM   BP 156/74   Pulse 81   Temp(Src) 97.7 ??F (36.5 ??C) (Oral)   Resp 16   Ht 5\' 7"  (1.702 m)   Wt 144 lb 4.8 oz (65.454 kg)   BMI 22.6 kg/m2   SpO2 98%   Filed Vitals:    08/07/12  0412 08/07/12 0505 08/07/12 0627 08/07/12 0756   BP: 181/79 129/54 142/67 156/74   Pulse: 77  88 81   Temp:    97.7 ??F (36.5 ??C)   TempSrc:    Oral   Resp:    16   Height:       Weight:       SpO2: 99%   98%         Intake/Output Summary (Last 24 hours) at 08/07/12 0956  Last data filed at 08/07/12 0419   Gross per 24 hour   Intake    860 ml   Output   2800 ml   Net  -1940 ml     Wt Readings from Last 3 Encounters:   08/03/12 144 lb 4.8 oz (65.454 kg)   11/23/11 144 lb (65.318 kg)   11/03/11 145 lb (65.772 kg)       Gen: Patient in NAD, resting comfortably  Neck: No JVD or bruits  Respiratory: CTAB no WRR  Chest: normal without deformity  Cardiovascular:RRR, S1S2, no  mrg, normal PMI  Abdomen: Soft, NTND, Normal BS  Extremities: No clubbing, cyanosis, or edema. Rt wrist hematoma signficantly improved but still slightly 'full'. Eccyhmosis significant. Excellent ulner and radial pulse, good cap refil 5/5 strength and sensation. Mildly tender  Neurological/Psychiatric: AxO x4, No gross motors/sensory deficits  Skin:  Warm and dry      Labs:  CBC:   Recent Labs      08/06/12   0616  08/07/12   0630   WBC  4.6  8.1   HGB  12.4  11.6*   HCT  36.3  35.1*   MCV  89.6  89.5   PLT  233  227     BMP:   Recent Labs      08/06/12   0616  08/07/12   0630   NA  135*  131*   K  4.1  3.4*   CL  103  98*   CO2  25  24   PHOS  3.8   --    BUN  14  8   CREATININE  0.7  0.6     MG:    Recent Labs      08/06/12   0616   MG  2.10      PT/INR: No results found for this basename: PROTIME, INR,  in the last 72 hours  APTT: No results found for this basename: APTT,  in the last 72 hours  Cardiac Enzymes: No results found for this basename: CKTOTAL, CKMB, CKMBINDEX, TROPONINI,  in the last 72 hours    Cardiac Studies:    ECHO  Overall left ventricular function is normal. Ejection fraction is   Visually estimated to be 55-60 %.  Mild concentric left ventricular hypertrophy is present.  Aortic valve appears sclerotic but opens  adequately.  Systolic pulmonary artery pressure (SPAP) is normal and estimated at 36   MmHg (RA pressure 3 mmHg).        Assessment and Plan     1. CAD: s/p PCI to mid LAD and kissing balloon inflation in provisional fashion to bifurcating diagonal with drug stent. Ostial LAD with 50% lesion that was negative by FFR   ~ Cont ASA, lisinopril, toprol, effient    ~ Add Lipitor     2. HTN: Was controlled but elevated overnight, probably due to rt wrist discomfort.   ~ Renal arteries normal   ~ f/u with hospital medicine and PCP     3. Rt wrist hematoms: resolving. No compartment syndrome. Doing well   ~ No pusching pulling lifting for 7 days   ~ Call with change in symptoms.   ~ Elevation, tylenol, ice/warm pack    Thank you for allowing me to participate in the care of your patient. Please call me with any questions [phone].      Alicia Amel, MD, [facility]   Interventional Cardiologist  [facility]  [phone] [facility]  [phone] [facility] Office  08/07/2012 9:56 AM

## 2012-08-07 NOTE — Progress Notes (Signed)
Cardiac Rehab referral completed and patient provided with a brochure. Instructed and given written information on diet, exercise, medications, risk factors, PCI site precautions, when to call the doctor, etc. Thank you for this referral.

## 2012-08-07 NOTE — Progress Notes (Signed)
Pt c/o headache 5 out of 10.  Pt requesting ibuprofen stating "it works the best for me."  Medication given and warm wash cloth applied to forehead.  Will continue to monitor.

## 2012-08-07 NOTE — Progress Notes (Signed)
BP 181/79.  PRN hydralazine given.  Will continue to monitor.

## 2012-08-07 NOTE — Progress Notes (Signed)
Cath site improved through the night.  Swelling decreased, elevated on pillow.  Slight bruising increased - outlined.  Tender to the touch.  Soft all over.  No further bleeding or oozing from site.  Hand warm, brachial pulse +2 radial +3, Sp02 99%.  Will continue to monitor.

## 2012-08-07 NOTE — Progress Notes (Signed)
Hospitalist Progress Note      PCP: Janalyn Harder, MD    Date of Admission: 08/05/2012    Chief Complaint on Admission: HTN, chest pain    Pt Seen/Examined and Chart Reviewed. Admitting dx hypertensive urgency     SUBJECTIVE:   Pt s/p LHC yesterday via R radial approach with stent placement, hematoma developed around site, cardiology addressed and held pressure, pt notes that hand feels well today, no loss of sensation, continues to be well perfused.    OBJECTIVE:     Allergies  Tape    Medications      Scheduled Meds:  ??? prasugrel  10 mg Oral Daily   ??? prasugrel  60 mg Oral Once   ??? metoprolol  25 mg Oral Daily   ??? aspirin  81 mg Oral Daily   ??? levothyroxine  75 mcg Oral QAM AC   ??? oxybutynin  10 mg Oral Daily   ??? traZODone  50 mg Oral Nightly   ??? sodium chloride flush  10 mL Intravenous Q12H [facility]   ??? enoxaparin  40 mg Subcutaneous Daily   ??? regadenoson  0.4 mg Intravenous Once   ??? lisinopril  40 mg Oral Daily   ??? isosorbide mononitrate  30 mg Oral Daily   ??? hydrALAZINE  50 mg Oral Q8H SCH       Infusions:  ??? sodium chloride 75 mL/hr at 08/06/12 0941       PRN Meds:  ondansetron, clonazePAM, ibuprofen, sodium chloride flush, acetaminophen, magnesium hydroxide, oxyCODONE, oxyCODONE, morphine, morphine, nitroGLYCERIN, hydrALAZINE    Vitals    TEMPERATURE:  Current - Temp: 97.7 ??F (36.5 ??C); Max - Temp  Avg: 97.5 ??F (36.4 ??C)  Min: 96.7 ??F (35.9 ??C)  Max: 98.1 ??F (36.7 ??C)  RESPIRATIONS RANGE: Resp  Avg: 15.8  Min: 15  Max: 16  PULSE RANGE: Pulse  Avg: 74.1  Min: 61  Max: 88  BLOOD PRESSURE RANGE:  Systolic (24hrs), Avg:160 mmHg, Min:120 mmHg, Max:191 mmHg  ; Diastolic (24hrs), Avg:78 mmHg, Min:54 mmHg, Max:104 mmHg    PULSE OXIMETRY RANGE: SpO2  Avg: 98.2 %  Min: 97 %  Max: 100 %  24HR INTAKE/OUTPUT:      Intake/Output Summary (Last 24 hours) at 08/07/12 0749  Last data filed at 08/07/12 0419   Gross per 24 hour   Intake    920 ml   Output   3800 ml   Net  -2880 ml       Exam:  General appearance: Pleasant  adult female in no distress, appears younger than stated age, thin   HEENT PERRL, EOMI, MMM  Neck: Supple, no jugular venous distention  Lungs: Unlabored respiration, clear to ascultation in all lung fields without Rales/Wheezes/Rhonchi  Heart: Regular rate and rhythm with normal S1/S2 without  murmurs, rubs or gallops  Abdomen: Thin, soft, non-tender & non-distended without rigidity or guarding and positive bowel sounds all four quadrants.  Extremities: No clubbing, cyanosis, or edema bilaterally, R hand w/ brace in place, small hematoma noted at base of R thumb and flexor aspect of R wrist, no pain on palpation, good capillary refill, 2+ radial pulse, sensation to light touch intact.  Skin: Normal color, no rashes/lesions  Neurologic: Alert and oriented X 3,  neurovascularly intact with sensory/motor intact upper extremities/lower extremities, bilaterally. Cranial nerves:II-XII intact, grossly non-focal.  Mental status: Alert, oriented, thought content appropriate.    Data    Recent Labs  08/06/12   0616   WBC  4.6   HGB  12.4   HCT  36.3   PLT  233      Recent Labs      08/06/12   0616   NA  135*   K  4.1   CL  103   CO2  25   PHOS  3.8   BUN  14   CREATININE  0.7     No results found for this basename: AST, ALT, ALB, BILIDIR, BILITOT, ALKPHOS,  in the last 72 hours    No results found for this basename: CKTOTAL, CKMB, CKMBINDEX, TROPONINI,  in the last 72 hours    Consults:     IP CONSULT TO HOSPITALIST  IP CONSULT TO CARDIOLOGY  IP CONSULT TO CARDIAC [facility]    Active Hospital Problems    Diagnosis Date Noted   ??? Hypertensive urgency [401.9] 08/04/2012   ??? Chest pain [786.50] 08/03/2012   ??? HTN (hypertension) [401.9] 08/03/2012   ??? Hypothyroid [244.9] 08/03/2012   ??? Hypokalemia [276.8] 08/03/2012         ASSESSMENT AND PLAN      Hypertensive urgency - Initially presented with chest pain and discomfort, may have been related to recent NSAID use?  BPs elevated overnight and this morning with pt noting a  headache.  Currently on Metoprolol 25mg  po bid (same as home dose), Lisinopril 40 mg po daily (home dose is 20mg  qday), Imdur 30mg  po daily (new) & Hydralazine 50mg  po q8h (new).  Pt to underwent renal artery doppler 7/28 which did not demonstrate any evidence of RAS.  Given continued hypertension overnight and this morning will plan to increase Toprol XL to 50 mg and monitor BP today, if remains stable will plan to discharge to home tomorrow on current regimen.    CAD - Chest pain resolved, ruled out for acute MI, cardiology consulted and following, recommendations appreciated, LHC 7/28 to evaluate for underlying coronary disease and anatomy demonstrated significant stenosis of the mid-LAD, s/p PCI w/ DES.  Pt loaded with Effient, continue now on Effient 10 mg daily for minimum of 1 year, ASA 81 mg daily, Atorvastatin 20 mg QHS, and Metoprolol XL 50 mg daily as per above.     Hypokalemia - 3.4 this AM, goal > 4.0, oral repletion, will continue to monitor.    Hypothyroid - controlled; TSH at goal, continue on synthroid 75 mcg daily    Normocytic anemia - suspect is chronic & likely related to HTN (chronic disease); H&H has remained stable during stay, currently 12.4, will continue to monitor.    DVT Prophylaxis: Lovenox  Diet: DIET CARDIAC;  Code Status: Full Code    PT/OT Eval Status: not needed    Dispo - Likely d/c home tomorrow if BP control improves following dose adjustment.    Rennie Plowman, MD  Internal Medicine  Pager: [phone]

## 2012-08-08 MED ORDER — PRASUGREL HCL 10 MG PO TABS
10 MG | ORAL_TABLET | Freq: Every day | ORAL | Status: DC
Start: 2012-08-08 — End: 2012-09-19

## 2012-08-08 MED ORDER — ATORVASTATIN CALCIUM 20 MG PO TABS
20 MG | ORAL_TABLET | Freq: Every evening | ORAL | Status: DC
Start: 2012-08-08 — End: 2012-09-19

## 2012-08-08 MED ORDER — HYDRALAZINE HCL 50 MG PO TABS
50 MG | ORAL_TABLET | Freq: Three times a day (TID) | ORAL | Status: DC
Start: 2012-08-08 — End: 2012-08-17

## 2012-08-08 MED ORDER — ISOSORBIDE MONONITRATE ER 30 MG PO TB24
30 MG | ORAL_TABLET | Freq: Every day | ORAL | Status: DC
Start: 2012-08-08 — End: 2012-09-17

## 2012-08-08 MED ORDER — NITROGLYCERIN 0.4 MG SL SUBL
0.4 MG | ORAL_TABLET | SUBLINGUAL | Status: DC | PRN
Start: 2012-08-08 — End: 2012-10-01

## 2012-08-08 MED ORDER — NITROGLYCERIN 0.4 MG SL SUBL
0.4 MG | ORAL_TABLET | SUBLINGUAL | Status: DC | PRN
Start: 2012-08-08 — End: 2012-08-08

## 2012-08-08 MED ORDER — HYDRALAZINE HCL 50 MG PO TABS
50 MG | ORAL_TABLET | Freq: Three times a day (TID) | ORAL | Status: DC
Start: 2012-08-08 — End: 2012-08-08

## 2012-08-08 MED ORDER — ISOSORBIDE MONONITRATE ER 30 MG PO TB24
30 MG | ORAL_TABLET | Freq: Every day | ORAL | Status: DC
Start: 2012-08-08 — End: 2012-08-08

## 2012-08-08 MED ORDER — LISINOPRIL 40 MG PO TABS
40 MG | ORAL_TABLET | Freq: Every day | ORAL | Status: DC
Start: 2012-08-08 — End: 2012-09-19

## 2012-08-08 MED ORDER — ATORVASTATIN CALCIUM 20 MG PO TABS
20 MG | ORAL_TABLET | Freq: Every evening | ORAL | Status: DC
Start: 2012-08-08 — End: 2012-08-08

## 2012-08-08 MED ADMIN — levothyroxine (SYNTHROID) tablet 75 mcg: ORAL | @ 10:00:00 | NDC 51079044001

## 2012-08-08 MED ADMIN — prasugrel (EFFIENT) tablet 10 mg: ORAL | @ 13:00:00 | NDC 00002512301

## 2012-08-08 MED ADMIN — hydrALAZINE (APRESOLINE) tablet 50 mg: ORAL | @ 01:00:00 | NDC 62584073311

## 2012-08-08 MED ADMIN — clonazePAM (KLONOPIN) tablet 0.5 mg: ORAL | @ 02:00:00 | NDC 63739026310

## 2012-08-08 MED ADMIN — hydrALAZINE (APRESOLINE) tablet 50 mg: ORAL | @ 10:00:00 | NDC 62584073311

## 2012-08-08 MED ADMIN — isosorbide mononitrate (IMDUR) CR tablet 30 mg: ORAL | @ 13:00:00 | NDC 00143223001

## 2012-08-08 MED ADMIN — LORazepam (ATIVAN) tablet 0.5 mg: ORAL | @ 06:00:00 | NDC 68084073611

## 2012-08-08 MED ADMIN — oxybutynin (DITROPAN-XL) CR tablet 10 mg: ORAL | @ 13:00:00 | NDC 00378660501

## 2012-08-08 MED ADMIN — traZODone (DESYREL) tablet 50 mg: ORAL | @ 01:00:00 | NDC 68084012411

## 2012-08-08 MED ADMIN — sodium chloride flush 0.9 % injection 10 mL: INTRAVENOUS | @ 13:00:00

## 2012-08-08 MED ADMIN — lisinopril (PRINIVIL;ZESTRIL) tablet 40 mg: ORAL | @ 13:00:00 | NDC 68084019811

## 2012-08-08 MED ADMIN — hydrALAZINE (APRESOLINE) injection 10 mg: INTRAVENOUS | @ 05:00:00 | NDC 63323061401

## 2012-08-08 MED ADMIN — atorvastatin (LIPITOR) tablet 20 mg: ORAL | @ 01:00:00 | NDC 68084009711

## 2012-08-08 MED ADMIN — metoprolol (TOPROL-XL) XL tablet 50 mg: ORAL | @ 13:00:00 | NDC 68001012200

## 2012-08-08 MED ADMIN — aspirin chewable tablet 81 mg: ORAL | @ 13:00:00 | NDC 66553000201

## 2012-08-08 MED ADMIN — sodium chloride flush 0.9 % injection 10 mL: INTRAVENOUS | @ 01:00:00

## 2012-08-08 MED FILL — MORPHINE SULFATE (PF) 2 MG/ML IV SOLN: 2 mg/mL | INTRAVENOUS | Qty: 1

## 2012-08-08 MED FILL — NORMAL SALINE FLUSH 0.9 % IV SOLN: 0.9 % | INTRAVENOUS | Qty: 10

## 2012-08-08 MED FILL — MORPHINE SULFATE (PF) 4 MG/ML IV SOLN: 4 MG/ML | INTRAVENOUS | Qty: 1

## 2012-08-08 MED FILL — LISINOPRIL 20 MG PO TABS: 20 MG | ORAL | Qty: 2

## 2012-08-08 MED FILL — IBUPROFEN 600 MG PO TABS: 600 MG | ORAL | Qty: 1

## 2012-08-08 MED FILL — HYDRALAZINE HCL 20 MG/ML IJ SOLN: 20 MG/ML | INTRAMUSCULAR | Qty: 1

## 2012-08-08 MED FILL — CLONAZEPAM 0.5 MG PO TABS: 0.5 MG | ORAL | Qty: 1

## 2012-08-08 MED FILL — TRAZODONE HCL 50 MG PO TABS: 50 MG | ORAL | Qty: 1

## 2012-08-08 MED FILL — EFFIENT 10 MG PO TABS: 10 mg | ORAL | Qty: 1

## 2012-08-08 MED FILL — ACETAMINOPHEN 325 MG PO TABS: 325 MG | ORAL | Qty: 2

## 2012-08-08 MED FILL — ATORVASTATIN CALCIUM 10 MG PO TABS: 10 MG | ORAL | Qty: 2

## 2012-08-08 MED FILL — OXYBUTYNIN CHLORIDE ER 5 MG PO TB24: 5 MG | ORAL | Qty: 2

## 2012-08-08 MED FILL — LOVENOX 40 MG/0.4ML SC SOLN: 40 MG/0.4ML | SUBCUTANEOUS | Qty: 0.4

## 2012-08-08 MED FILL — LORAZEPAM 0.5 MG PO TABS: 0.5 MG | ORAL | Qty: 1

## 2012-08-08 MED FILL — ISOSORBIDE MONONITRATE ER 30 MG PO TB24: 30 MG | ORAL | Qty: 1

## 2012-08-08 MED FILL — ASPIRIN LOW STRENGTH 81 MG PO CHEW: 81 MG | ORAL | Qty: 1

## 2012-08-08 MED FILL — HYDRALAZINE HCL 25 MG PO TABS: 25 MG | ORAL | Qty: 2

## 2012-08-08 MED FILL — METOPROLOL SUCCINATE ER 50 MG PO TB24: 50 MG | ORAL | Qty: 1

## 2012-08-08 MED FILL — LEVOTHYROXINE SODIUM 25 MCG PO TABS: 25 MCG | ORAL | Qty: 1

## 2012-08-08 NOTE — Progress Notes (Addendum)
Hospitalist Progress Note      PCP: Janalyn Harder, MD    Date of Admission: 08/05/2012    Chief Complaint on Admission: HTN, chest pain    Pt Seen/Examined and Chart Reviewed. Admitting dx hypertensive urgency     SUBJECTIVE:   Pt reports headache overnight that has since improved this morning, otherwise no acute issues or complaints this AM.  Denies chest pain/pressure, palpitations, shortness of breath, abdominal pain, n/v/d, or fevers/chills.    OBJECTIVE:     Allergies  Tape    Medications      Scheduled Meds:  ??? metoprolol  50 mg Oral Daily   ??? atorvastatin  20 mg Oral Nightly   ??? prasugrel  10 mg Oral Daily   ??? prasugrel  60 mg Oral Once   ??? aspirin  81 mg Oral Daily   ??? levothyroxine  75 mcg Oral QAM AC   ??? oxybutynin  10 mg Oral Daily   ??? traZODone  50 mg Oral Nightly   ??? sodium chloride flush  10 mL Intravenous Q12H [facility]   ??? enoxaparin  40 mg Subcutaneous Daily   ??? regadenoson  0.4 mg Intravenous Once   ??? lisinopril  40 mg Oral Daily   ??? isosorbide mononitrate  30 mg Oral Daily   ??? hydrALAZINE  50 mg Oral Q8H SCH       Infusions:  ??? sodium chloride 75 mL/hr at 08/06/12 0941       PRN Meds:  ondansetron, clonazePAM, ibuprofen, sodium chloride flush, acetaminophen, magnesium hydroxide, oxyCODONE, oxyCODONE, morphine, morphine, nitroGLYCERIN, hydrALAZINE    Vitals    TEMPERATURE:  Current - Temp: 97.9 ??F (36.6 ??C); Max - Temp  Avg: 97.8 ??F (36.6 ??C)  Min: 97.6 ??F (36.4 ??C)  Max: 98 ??F (36.7 ??C)  RESPIRATIONS RANGE: Resp  Avg: 16.3  Min: 16  Max: 17  PULSE RANGE: Pulse  Avg: 77.1  Min: 70  Max: 89  BLOOD PRESSURE RANGE:  Systolic (24hrs), Avg:148 mmHg, Min:114 mmHg, Max:184 mmHg  ; Diastolic (24hrs), Avg:71 mmHg, Min:49 mmHg, Max:89 mmHg    PULSE OXIMETRY RANGE: SpO2  Avg: 98.8 %  Min: 97 %  Max: 100 %  24HR INTAKE/OUTPUT:      Intake/Output Summary (Last 24 hours) at 08/08/12 0745  Last data filed at 08/08/12 1478   Gross per 24 hour   Intake   1440 ml   Output   4250 ml   Net  -2810 ml        Exam:  General appearance: Pleasant adult female in no distress, appears younger than stated age, thin, awake in bed this morning  HEENT PERRL, EOMI, MMM  Neck: Supple, no jugular venous distention  Lungs: Unlabored respiration, clear to ascultation in all lung fields without Rales/Wheezes/Rhonchi  Heart: Regular rate and rhythm with normal S1/S2 without  murmurs, rubs or gallops  Abdomen: Thin, soft, non-tender & non-distended without rigidity or guarding and positive bowel sounds all four quadrants.  Extremities: No clubbing, cyanosis, or edema bilaterally, R hand w/ brace in place, improvement in size of small hematoma noted at base of R thumb and flexor aspect of R wrist, no pain on palpation, 2+ radial pulse, sensation to light touch intact, normal capillary refill.  Skin: Normal color, no rashes/lesions  Neurologic: Alert and oriented X 3,  neurovascularly intact with sensory/motor intact upper extremities/lower extremities, bilaterally. Cranial nerves:II-XII intact, grossly non-focal.  Mental status: Alert, oriented, thought content appropriate.  Data    Recent Labs      08/06/12   0616  08/07/12   0630   WBC  4.6  8.1   HGB  12.4  11.6*   HCT  36.3  35.1*   PLT  233  227      Recent Labs      08/06/12   0616  08/07/12   0630   NA  135*  131*   K  4.1  3.4*   CL  103  98*   CO2  25  24   PHOS  3.8   --    BUN  14  8   CREATININE  0.7  0.6     No results found for this basename: AST, ALT, ALB, BILIDIR, BILITOT, ALKPHOS,  in the last 72 hours    No results found for this basename: CKTOTAL, CKMB, CKMBINDEX, TROPONINI,  in the last 72 hours    Consults:     IP CONSULT TO HOSPITALIST  IP CONSULT TO CARDIOLOGY  IP CONSULT TO CARDIAC [facility]    Active Hospital Problems    Diagnosis Date Noted   ??? Hypertensive urgency [401.9] 08/04/2012   ??? Chest pain [786.50] 08/03/2012   ??? HTN (hypertension) [401.9] 08/03/2012   ??? Hypothyroid [244.9] 08/03/2012   ??? Hypokalemia [276.8] 08/03/2012         ASSESSMENT AND PLAN       Hypertensive urgency - Initially presented with chest pain and discomfort, may have been related to recent NSAID use?  BPs elevated overnight on 7/28 and morning of 7/29 with pt noting a headache.  Currently on Metoprolol 50 mg po bid (previous home dose 25 mg), Lisinopril 40 mg po daily (home dose is 20mg  qday), Imdur 30mg  po daily (new) & Hydralazine 50mg  po q8h (new).  Pt to underwent renal artery doppler 7/28 which did not demonstrate any evidence of RAS.  Given continued hypertension overnight on 7/28  increased Toprol XL to 50 mg and monitored BP,  Improved throughout day yesterday and again this morning, will plan to discharge to home tomorrow on current regimen.    Addendum: Discussed with patient, she had been taking Toprol XL 25 mg BID at home, not daily, will resume this regime at discharge rather than 50 mg daily given overnight elevated pressures.    CAD - Chest pain resolved, ruled out for acute MI, cardiology consulted and following, recommendations appreciated, LHC 7/28 to evaluate for underlying coronary disease and anatomy demonstrated significant stenosis of the mid-LAD, s/p PCI w/ DES.  Pt loaded with Effient, continue now on Effient 10 mg daily for minimum of 1 year, ASA 81 mg daily, Atorvastatin 20 mg QHS, and Metoprolol XL 50 mg daily as per above.     Hypokalemia - Goal > 4.0, oral repletion, will continue to monitor.    Hypothyroid - controlled; TSH at goal, continue on synthroid 75 mcg daily    Normocytic anemia - suspect is chronic & likely related to HTN (chronic disease); H&H has remained stable during stay, currently 12.4, will continue to monitor.    DVT Prophylaxis: Lovenox  Diet: DIET CARDIAC;  Code Status: Full Code    PT/OT Eval Status: not needed    Dispo - Likely d/c home today    Rennie Plowman, MD  Internal Medicine  Pager: [phone]

## 2012-08-08 NOTE — Plan of Care (Signed)
Problem: Falls - Risk of  Goal: Absence of falls  Outcome: Ongoing  Pt remains free from fall and injury. Non-skid slippers on. Instructed to call for assistance. Call light in reach, will monitor.

## 2012-08-08 NOTE — Progress Notes (Signed)
Supplied 30 days of Effient to patient.    Jacques Earthly, PharmD, [facility]  Clinical Pharmacy [facility]  [phone]  [phone] (digital page)

## 2012-08-08 NOTE — Discharge Summary (Signed)
Hospital Medicine Discharge Summary      Patient ID: Destiny Salinas      Patient's PCP: Janalyn Harder, MD    Admit Date: 08/05/2012     Discharge Date: 08/08/2012      Admitting Physician: Philbert Riser, MD    Discharge Physician: Rennie Plowman, MD     Discharge Diagnoses:     Active Hospital Problems    Diagnosis Date Noted   ??? Hypertensive urgency [401.9] 08/04/2012   ??? Chest pain [786.50] 08/03/2012   ??? HTN (hypertension) [401.9] 08/03/2012   ??? Hypothyroid [244.9] 08/03/2012   ??? Hypokalemia [276.8] 08/03/2012         The patient was seen and examined on day of discharge and this discharge summary is in conjunction with any daily progress note from day of discharge.    Hospital Course:     Hypertensive urgency - Initially presented with chest pain and discomfort, may have been related to recent NSAID use? BPs elevated overnight on 7/28 and morning of 7/29 with pt noting a headache. Currently on Metoprolol XL 50 mg daily (previous home dose 25 mg), Lisinopril 40 mg po daily (home dose is 20mg  qday), Imdur 30mg  po daily (new) & Hydralazine 50mg  po q8h (new). Pt underwent renal artery doppler 7/28 which did not demonstrate any evidence of RAS. Given continued hypertension overnight on 7/28 increased Toprol XL to 50 mg on 7/29 and monitored BP, which improved throughout day and the following morning, increased pressures noted overnight with headache.  After discussion with patient she noted she takes her Toprol XL twice a day at home, not daily.  Suspect that dosing regimen during admission was causing elevated BPs overnight as pt's pressures have been at goal during the day.  Will discharge home on previous regimen of Toprol XL 25 mg BID, with additions as noted above.     CAD: Chest pain resolved during stay, ruled out for acute MI with serially negative cardiac enzymes, cardiology was consulted as pt had recently underwent myocardial perfusion scan which was read as negative on 7/25, she subsequently  underwent LHC on 7/28 to evaluate for underlying coronary disease and anatomy.  Cath demonstrated significant stenosis of the mid-LAD, s/p PCI w/ DES. Pt loaded with Effient, and will be continued on Effient 10 mg daily for minimum of 1 year, ASA 81 mg daily, Atorvastatin 20 mg QHS, and Metoprolol XL 25 mg BID as per above. Will have follow-up with cardiology.      R radial hematoma: LHC was complicated by hematoma of the R radial artery, this was evacuated by cardiology that evening and patient fully recovered without issues, normal radial and ulnar pulses, sensation intact, she has been instructed on avoidance of activities post-cath.    Hypokalemia - Goal > 4.0, oral repletion during hospital stay    Hypothyroid - controlled; TSH at goal, continued on synthroid 75 mcg daily     Normocytic anemia - suspect is chronic & likely related to HTN (chronic disease); H&H remained stable during stay.    Consults:     IP CONSULT TO HOSPITALIST  IP CONSULT TO CARDIOLOGY  IP CONSULT TO CARDIAC REHAB    Significant Diagnostic Studies: as above, and as follows:  ANGIOGRAPHIC FINDINGS:   1. Left main coronary artery has no angiographic disease, bifurcates into the LAD and left circumflex.   2. The LAD had an ostial 50% lesion. This was checked by FFR and found to be negative at  0.91. The LAD itself was a very large vessel. On the midsection, there was an area of ectatic disease and lesions up to 80%. The remainder of the LAD had luminal irregularities. There was a large diagonal   vessel coming off the mid LAD just before that section of disease. This was the lesion that was stented across.   3. Left circumflex had a small vessel that continued in the AV groove. There was a very large obtuse marginal 1 branch that came off the side.   4. The right coronary artery was a large, dominant vessel with luminal irregularities.   5. LVEDP was about 15.   6. Left ventricular ejection fraction was 65%.   7. No aortic  stenosis.        Treatments: as above    Disposition: Home w/out home health care needs.    Discharged Condition: Stable    Code Status: Full code    Activity: activity as tolerated    Diet: cardiac diet    Follow Up: Primary Care Physician in one week    Labs: For convenience and continuity at follow-up the following most recent labs are provided:    CBC:   Lab Results   Component Value Date    WBC 8.1 08/07/2012    HGB 11.6 08/07/2012    HCT 35.1 08/07/2012    PLT 227 08/07/2012       RENAL:   Lab Results   Component Value Date    NA 131 08/07/2012    K 3.4 08/07/2012    CL 98 08/07/2012    CO2 24 08/07/2012    BUN 8 08/07/2012    CREATININE 0.6 08/07/2012           Discharge Medications:   Discharge Medication List as of 08/08/2012  1:16 PM           Details   prasugrel (EFFIENT) 10 MG TABS Take 1 tablet by mouth daily., Disp-30 tablet, R-3              Details   hydrALAZINE (APRESOLINE) 50 MG tablet Take 1 tablet by mouth every 8 hours., Disp-90 tablet, R-3      lisinopril (PRINIVIL;ZESTRIL) 40 MG tablet Take 1 tablet by mouth daily., Disp-30 tablet, R-3      isosorbide mononitrate (IMDUR) 30 MG CR tablet Take 1 tablet by mouth daily., Disp-30 tablet, R-1      nitroGLYCERIN (NITROSTAT) 0.4 MG SL tablet Place 1 tablet under the tongue every 5 minutes as needed for Chest pain., Disp-25 tablet, R-0      atorvastatin (LIPITOR) 20 MG tablet Take 1 tablet by mouth nightly., Disp-30 tablet, R-1              Details   metoprolol (TOPROL XL) 25 MG XL tablet Take 25 mg by mouth 2 times daily.      ibuprofen (ADVIL;MOTRIN) 200 MG tablet Take 600 mg by mouth every 6 hours as needed.      oxybutynin (DITROPAN-XL) 10 MG CR tablet Take 10 mg by mouth daily.      clonazePAM (KLONOPIN) 0.5 MG tablet Take 0.5 mg by mouth 2 times daily as needed.      aspirin 81 MG chewable tablet Take 81 mg by mouth daily.      traZODone (DESYREL) 50 MG tablet Take 50 mg by mouth nightly.      levothyroxine (LEVOTHROID) 75 MCG tablet Take 1 tablet by mouth  daily., Disp-90 tablet, R-3  Time Spent on discharge is more than 30 minutes in the examination, evaluation, counseling and review of medications and discharge plan.      Signed:  Rennie Plowman, MD   08/08/2012      Thank you Janalyn Harder, MD for the opportunity to be involved in this patient's care. If you have any questions or concerns please feel free to contact me at [phone].

## 2012-08-08 NOTE — Progress Notes (Signed)
Pt became highly anxious about her elevated BP and her headache.  Gave prn hydralazine, tylenol, and morphine.  Pt still continued to be anxious and would not settle down.  Call placed to hospitalist, received orders for 1x dose ativan.  Gave pt ice pack to put on head.  Will monitor

## 2012-08-08 NOTE — Progress Notes (Deleted)
Pt A/O resting in bed. Vs stable, assessment complete as charted. Denies pain or needs at this time. Call light in reach, will continue to monitor. Khloee Garza Speigel

## 2012-08-08 NOTE — Discharge Instructions (Signed)
FOLLOW-UP APPOINTMENTS    Follow-up appointment on August 8th at 9AM with Dr. Alicia Amel.  [facility],  [facility], [address], [address].  [place], [place] [address].  Office #: [phone].    If you are unable to make this appointment, please call to reschedule.      Hospitalist Discharge Instructions:    Follow up:  1. Dr. Janalyn Harder, MD in the next 1-2 weeks.  2. Cardiology w/ Dr. Huntley Dec as scheduled above    Medication Changes:  1. Please continue to take your Metoprolol XL 25 mg twice a day  2. We have added the following medications to your regimen following your left heart catheterization and stent placement: Atorvastatin 20 mg nightly (for cholesterol and heart protection), Isosorbide mononitrate 30 mg daily (for blood pressure), Hydralazine 50 mg three times a day (for blood pressure)  3. Prasugrel 10 mg daily, it is extremely important you do not miss any doses of this medication after having your stent placed  4. We have increased the dose of your lisinopril to 40 mg daily  5. You also will now have a prescription for nitroglycerin as needed for chest pain, take one dose every five minutes for up to three doses, if you do not have improvement in your pain you should notify your physician immediately and return to the ED for re-evaluation.    For medical questions after discharge, contact the Answering Service at [phone] to have the physician on call paged.  For medication questions, contact the pharmacist on call at [phone].  Your discharging physician is Dr. Rennie Plowman and your admitting physician was Dr. Philbert Riser, MD.

## 2012-08-08 NOTE — Progress Notes (Signed)
Pt A/O resting in bed. Vs stable, assessment complete as charted. Denies pain or needs at this time. Call light in reach, will continue to monitor. Destiny Salinas

## 2012-08-16 NOTE — Telephone Encounter (Signed)
Patient calling states she has hives say she can manage it with bendryl has appt on 8/8 with dr.paquin

## 2012-08-16 NOTE — Telephone Encounter (Signed)
 Patient calling states she has hives say she can manage it with bendryl has appt on 8/8 with dr.paquin

## 2012-08-17 NOTE — Progress Notes (Signed)
[facility]   Cardiovascular Evaluation    PATIENT: Destiny Salinas  DATE: 08/17/2012  MRN: [id number]  CSN: [id number]  DOB: February 08, 1932      Primary Care Doctor: Janalyn Harder, MD  Reason for evaluation:   Follow-Up from Hospital; Hypertension; and Fatigue      Subjective:   History of present illness on initial date of evaluation:   Destiny Salinas is a 79 y.o. patient who presents for hospital follow up. He presented to the ER with complaints of chest pain.  She had a history of hypertension and hypothyroidism.  She had a stress test that showed no ischemia by nuclear perfusion but did have 2 mm of ST depression.  This was in the setting of severe uncontrolled blood pressure with systolic greater than 200.  In the hospital, she continued to have anginal type chest pains.  Therefore, she was brought to the cardiac catheterization lab for evaluation of the above findings.   Today she reports her right radial cath site is healing well. She reports since stenting, she is still having dizziness but has improved. She denies CP, SOB, palpitations, or syncope. She reports she has been fatigued lately. She is tolerating medications. She states her SBP is 140s when she first wakes up before medications. She states she checks her BP after medication, her SBP is 120-130s. She informed her PCP about her fatigue. She states fatigue is overall energy instead of muscle fatigue. Her PCP feels it is deconditioning due to cardiac procedure. She denies black tarry stools. She has hives across her abdomen, legs, and on her back. She reports hives started 2-3 days ago. She has not changed soaps or detergents.       Patient Active Problem List   Diagnosis   ??? Abnormal mammogram   ??? Menopausal state   ??? Wound, open, leg   ??? Edema   ??? Chest pain   ??? HTN (hypertension)   ??? Hypothyroid   ??? Hypokalemia   ??? Hypertensive urgency   ??? CAD (coronary artery disease)         Past Medical History:   has a past medical  history of Thyroid disease; Psychiatric problem; Hypertension; Urinary frequency; and CAD (coronary artery disease).    Surgical History:   has past surgical history that includes fracture surgery; Knee arthroscopy; Appendectomy; Tonsillectomy; Colonoscopy (2006); eye surgery; Hysterectomy; and Coronary angioplasty with stent (08/06/2012).     Social History:   reports that she has never smoked. She has never used smokeless tobacco. She reports that she drinks about 8.4 ounces of alcohol per week. She reports that she does not use illicit drugs.     Family History:  No evidence for sudden cardiac death or premature CAD    Home Medications:  Reviewed and are listed in nursing record. and/or listed below  Current Outpatient Prescriptions   Medication Sig Dispense Refill   ??? prasugrel (EFFIENT) 10 MG TABS Take 1 tablet by mouth daily.  30 tablet  3   ??? lisinopril (PRINIVIL;ZESTRIL) 40 MG tablet Take 1 tablet by mouth daily.  30 tablet  3   ??? isosorbide mononitrate (IMDUR) 30 MG CR tablet Take 1 tablet by mouth daily.  30 tablet  1   ??? nitroGLYCERIN (NITROSTAT) 0.4 MG SL tablet Place 1 tablet under the tongue every 5 minutes as needed for Chest pain.  25 tablet  0   ??? atorvastatin (LIPITOR) 20 MG tablet Take  1 tablet by mouth nightly.  30 tablet  1   ??? metoprolol (TOPROL XL) 25 MG XL tablet Take 25 mg by mouth 2 times daily.       ??? ibuprofen (ADVIL;MOTRIN) 200 MG tablet Take 600 mg by mouth every 6 hours as needed.       ??? oxybutynin (DITROPAN-XL) 10 MG CR tablet Take 10 mg by mouth daily.       ??? clonazePAM (KLONOPIN) 0.5 MG tablet Take 0.5 mg by mouth 2 times daily as needed.       ??? aspirin 81 MG chewable tablet Take 81 mg by mouth daily.       ??? traZODone (DESYREL) 50 MG tablet Take 50 mg by mouth nightly.       ??? levothyroxine (LEVOTHROID) 75 MCG tablet Take 1 tablet by mouth daily.  90 tablet  3     No current facility-administered medications for this visit.        Allergies:  Tape     Review of Systems:   All 12  point review of symptoms completed. Pertinent positives identified in the HPI, all other review of symptoms negative as below.    Objective:   PHYSICAL EXAM:    Filed Vitals:    08/17/12 0919   BP: 130/80   Pulse: 55    Weight: 142 lb 9.6 oz (64.683 kg)     Wt Readings from Last 3 Encounters:   08/17/12 142 lb 9.6 oz (64.683 kg)   08/03/12 144 lb 4.8 oz (65.454 kg)   11/23/11 144 lb (65.318 kg)         General Appearance:  Alert, cooperative, no distress, appears stated age   Head:  Normocephalic, atraumatic   Eyes:  PERRL, conjunctiva/corneas clear   Nose: Nares normal, no drainage or sinus tenderness   Throat: Lips, mucosa, and tongue normal   Neck: Supple, symmetrical, trachea midline, NL thyroid no carotid bruit or JVD   Lungs:   CTAB, respirations unlabored   Chest Wall:  No tenderness or deformity   Heart:  Regular rhythm and normal rate; S1, S2 are normal;   no murmur noted; no rub or gallop   Abdomen:   Soft, non-tender, +BS x 4, no masses, no organomegaly   Extremities: Extremities normal, atraumatic, no cyanosis or edema, RT wrist with slight puffiness over radial artery but full strength, pulse, and sensation. No eccymosis or hematoma   Pulses: 2+ and symmetric   Skin: Skin color, texture, turgor normal, diffuse flat pale red patches around waist, over stomach and lower back. nonblanching   Pysch: Normal mood and affect   Neurologic: Normal gross motor and sensory exam.         LABS   CBC:      Lab Results   Component Value Date    WBC 8.1 08/07/2012    RBC 3.92 08/07/2012    HGB 11.6 08/07/2012    HCT 35.1 08/07/2012    MCV 89.5 08/07/2012    RDW 13.1 08/07/2012    PLT 227 08/07/2012     CMP:  Lab Results   Component Value Date    NA 131 08/07/2012    K 3.4 08/07/2012    CL 98 08/07/2012    CO2 24 08/07/2012    BUN 8 08/07/2012    CREATININE 0.6 08/07/2012    GFRAA >60 08/07/2012    GFRAA >60 06/26/2010    AGRATIO 2.4 08/03/2012    LABGLOM >60 08/07/2012  GLUCOSE 118 08/07/2012    PROT 6.1 08/03/2012    PROT 6.9  06/26/2010    CALCIUM 8.9 08/07/2012    BILITOT <0.2 08/03/2012    ALKPHOS 51 08/03/2012    AST 22 08/03/2012    ALT 23 08/03/2012     PT/INR:   No components found with this basename: PTPATIENT,  PTINR     Liver:  No components found with this basename: CHLPL     Lab Results   Component Value Date    ALT 23 08/03/2012    AST 22 08/03/2012    ALKPHOS 51 08/03/2012    BILITOT <0.2 08/03/2012     No results found for this basename: LABA1C     Lipids:         Lab Results   Component Value Date    TRIG 55 06/26/2010    TRIG 43 06/04/2008            Lab Results   Component Value Date    HDL 94* 06/26/2010    HDL 79* 06/04/2008            Lab Results   Component Value Date    LDLCALC 124* 06/26/2010    LDLCALC 104* 06/04/2008            Lab Results   Component Value Date    LABVLDL 11 06/26/2010    LABVLDL 9 06/04/2008         CARDIAC DATA   EKG:  08/04/2012  NSR, NSSt changes.     ECHO/MUGA: 08/04/12  Summary   Overall left ventricular function is normal. Ejection fraction is   visually estimated to be 55-60 %.   Mild concentric left ventricular hypertrophy is present.   Aortic valve appears sclerotic but opens adequately.   Systolic pulmonary artery pressure (SPAP) is normal and estimated at 36   mmHg (RA pressure 3 mmHg).      STRESS TEST: 08/03/12  Findings: Myocardial perfusion is grossly normal at both stress and rest. Left ventricular cavity size is normal and wall motion is uniform. The estimated ejection fraction is 57%.      CARDIAC CATH: 08/06/12  ANGIOGRAPHIC FINDINGS:  1.  Left main coronary artery has no angiographic disease, bifurcates into  the LAD and left circumflex.  2.  The LAD had an ostial 50% lesion.  This was checked by FFR and found to  be negative at 0.91.  The LAD itself was a very large vessel.  On the  midsection, there was an area of ectatic disease and lesions up to 80%. The  remainder of the LAD had luminal irregularities.  There was a large diagonal  vessel coming off the mid LAD just before that section of  disease.  This was  the lesion that was stented across.  3.  Left circumflex had a small vessel that continued in the AV groove.    There was a very large obtuse marginal 1 branch that came off the side.  4.  The right coronary artery was a large, dominant vessel with luminal  irregularities.    5.  LVEDP was about 15.  6.  Left ventricular ejection fraction was 65%.    7.  No aortic stenosis.      ASSESSMENT:  Significant stenosis of the mid left anterior descending, status  post treatment with Xience Xpedition 3.5 x 23 mm drug-eluting stent with  kissing balloon inflations in a provisional fashion to the side branch  diagonal  vessel.  There is 0% residual stenosis, TIMI 3 flow.        PLAN:     1.  At this time, patient will continue overnight admission for observation  following intervention.  I will load her with Effient, continue on 10 mg  daily.  The patient is at the weight limit for Effient, so will have to watch  her carefully to ensure that she does not need 5 mg daily instead.  Will  continue Effient for a minimum of 1 year.    2.  Aspirin 81 mg daily for life.  3.  Aggressive risk factor reduction and medical management.        Renal US: 08/06/12  Impressions Right Impression The right kidney measures 10 cm. The right renal vein is patent. There is no evidence to suggest renal artery stenosis. Left Impression The left kidney measure 11.2 cm. The left renal vein is patent. The distal renal artery was not seen due to severe bowel gas, within the limits of this exam there is no evidence to suggest renal artery stenosis.   Conclusions    Summary    Bilateral renal artery ultrasound imaging was performed. No evidence of  renal artery stenosis. Renal veins are patent.          Assessment and Plan   Destiny Salinas is a 78 y.o. female who presents today for the following problems:         1. CAD: + ST depression but normal perfusion on stress testing. Taken to cath lab that showed 2 LAD lesions The ostial  lesion was negative by FFR and will medically manage, The mid LAD was 80% and treated with drug stent. CP now resolved   ~ Cont ASA 81mg  for life   ~ Effient 10mg  qday for 1 yr minimum   ~ Cont Imdur, lipitor    2. HTN: sudden elevation with heavy USE of NSAIDS for back pain              ~ No renal artery stenosis on imaging   ~ Doing better, stop hydralazine (poly-pharmacy) and call if home SBP >140's    3. Fatigue: nonspecific,    ~ Trial off lipitor   ~ Check CBC with diff, CK, CKMB< etc    4. Rash: DOes not appear to be TTP from antiplatelets. Will check CBC with diff.   ~ asked to f/u with PCP but is improving.     5. Rt wrist hematoma: resolved. Normal function,       Plan:  1. Continue aspirin and Effient. Hold lipitor for 1 week. Restart after 1 week. Call office if symptoms of fatigue or hives returns. [phone]  2. Stop hydralazine  3. Check BP daily at home. Keep a log to bring with you on follow up. Goal is less than 140/90.  4. Check CBC with Diff, Ck, CKMB, LFT, INR    5. Cardiac rehab referral  6. Carotid dopplers  7. Follow up with me in 1 month    It is a pleasure to assist in the care of [employer]. Please call with any questions.  All questions and concerns were addressed to the patient/family. Alternatives to my treatment were discussed. The note was completed using EMR. Every effort was made to ensure accuracy; however, inadvertent computerized transcription errors may be present.    Alicia Amel, MD, [facility]   Interventional Cardiologist  [facility]  [phone] Dareen Piano Office  (  [phone] [facility]  08/17/2012  9:49 AM

## 2012-08-17 NOTE — Patient Instructions (Signed)
Plan:  1. Continue aspirin and Effient. Hold lipitor for 1 week. Restart after 1 week. Call office if symptoms of fatigue or hives returns. [phone]  2. Stop hydralazine  3. Check BP daily at home. Keep a log to bring with you on follow up. Goal is less than 140/90.  4. Check CBC with Diff, Ck, CKMB, LFT, INR    5. Cardiac rehab referral  6. Carotid dopplers  7. Follow up with me in 1 month

## 2012-08-18 LAB — CBC WITH AUTO DIFFERENTIAL
Basophils %: 0.6 %
Basophils Absolute: 0 10*3/uL (ref 0.0–0.2)
Eosinophils %: 11.9 %
Eosinophils Absolute: 0.6 10*3/uL (ref 0.0–0.6)
Hematocrit: 33.7 % — ABNORMAL LOW (ref 36.0–48.0)
Hemoglobin: 11.4 g/dL — ABNORMAL LOW (ref 12.0–16.0)
Lymphocytes %: 19.1 %
Lymphocytes Absolute: 1 10*3/uL (ref 1.0–5.1)
MCH: 30.2 pg (ref 26.0–34.0)
MCHC: 33.7 g/dL (ref 31.0–36.0)
MCV: 89.6 fL (ref 80.0–100.0)
MPV: 9.6 fL (ref 5.0–10.5)
Monocytes %: 9.2 %
Monocytes Absolute: 0.5 10*3/uL (ref 0.0–1.3)
Neutrophils %: 59.2 %
Neutrophils Absolute: 3 10*3/uL (ref 1.7–7.7)
Platelets: 231 10*3/uL (ref 135–450)
RBC: 3.76 M/uL — ABNORMAL LOW (ref 4.00–5.20)
RDW: 13.1 % (ref 12.4–15.4)
WBC: 5.1 10*3/uL (ref 4.0–11.0)

## 2012-08-18 LAB — HEPATIC FUNCTION PANEL
ALT: 25 U/L (ref 10–40)
AST: 26 U/L (ref 15–37)
Albumin: 4.2 g/dL (ref 3.4–5.0)
Alkaline Phosphatase: 51 U/L (ref 40–129)
Bilirubin, Direct: 0.1 mg/dL (ref 0.0–0.3)
Bilirubin, Indirect: 0.2 mg/dL (ref 0.0–1.0)
Total Bilirubin: 0.3 mg/dL (ref 0.0–1.0)
Total Protein: 6.1 g/dL — ABNORMAL LOW (ref 6.4–8.2)

## 2012-08-18 LAB — CK-MB: CK-MB: 1.9 ng/mL (ref <5)

## 2012-08-18 LAB — PROTIME-INR
INR: 1.04 (ref 0.85–1.16)
Protime: 11.2 s (ref 9.1–12.6)

## 2012-08-18 LAB — CK: Total CK: 83 U/L (ref 26–192)

## 2012-09-03 NOTE — Progress Notes (Signed)
Please let her know that her carotid doppler showed a very mild (i.e. Nothing significant) neck blockages. Plan to continue medicine only at this time.

## 2012-09-17 NOTE — Progress Notes (Signed)
[facility]   Cardiovascular Evaluation    PATIENT: VALEDA CORZINE  DATE: 09/17/2012  MRN: [id number]  CSN: [id number]  DOB: Jun 07, 1932      Primary Care Doctor: Janalyn Harder, MD  Reason for evaluation:   No chief complaint on file.      Subjective:   History of present illness on initial date of evaluation:   LOWELL MCGURK is a 79 y.o. patient who presents for hospital follow up. He presented to the ER with complaints of chest pain.  She had a history of hypertension and hypothyroidism.  She had a stress test that showed no ischemia by nuclear perfusion but did have 2 mm of ST depression.  This was in the setting of severe uncontrolled blood pressure with systolic greater than 200.  In the hospital, she continued to have anginal type chest pains.  Therefore, she was brought to the cardiac catheterization lab for evaluation of the above findings.   Today she reports she is doing well overall. She is back to riding her bicycle daily without difficulty. States her B/P has been stable at home. It does become elevated with high levels of stress at times.     Patient Active Problem List   Diagnosis   ??? Abnormal mammogram   ??? Menopausal state   ??? Wound, open, leg   ??? Edema   ??? Chest pain   ??? HTN (hypertension)   ??? Hypothyroid   ??? Hypokalemia   ??? Hypertensive urgency   ??? CAD (coronary artery disease)         Past Medical History:   has a past medical history of Thyroid disease; Psychiatric problem; Hypertension; Urinary frequency; and CAD (coronary artery disease).    Surgical History:   has past surgical history that includes fracture surgery; Knee arthroscopy; Appendectomy; Tonsillectomy; Colonoscopy (2006); eye surgery; Hysterectomy; and Coronary angioplasty with stent (08/06/2012).     Social History:   reports that she has never smoked. She has never used smokeless tobacco. She reports that she drinks about 8.4 ounces of alcohol per week. She reports that she does not use illicit drugs.          Family History:  No evidence for sudden cardiac death or premature CAD    Home Medications:  Reviewed and are listed in nursing record. and/or listed below  Current Outpatient Prescriptions   Medication Sig Dispense Refill   ??? prasugrel (EFFIENT) 10 MG TABS Take 1 tablet by mouth daily.  30 tablet  3   ??? lisinopril (PRINIVIL;ZESTRIL) 40 MG tablet Take 1 tablet by mouth daily.  30 tablet  3   ??? isosorbide mononitrate (IMDUR) 30 MG CR tablet Take 1 tablet by mouth daily.  30 tablet  1   ??? nitroGLYCERIN (NITROSTAT) 0.4 MG SL tablet Place 1 tablet under the tongue every 5 minutes as needed for Chest pain.  25 tablet  0   ??? atorvastatin (LIPITOR) 20 MG tablet Take 1 tablet by mouth nightly.  30 tablet  1   ??? metoprolol (TOPROL XL) 25 MG XL tablet Take 25 mg by mouth 2 times daily.       ??? ibuprofen (ADVIL;MOTRIN) 200 MG tablet Take 600 mg by mouth every 6 hours as needed.       ??? oxybutynin (DITROPAN-XL) 10 MG CR tablet Take 10 mg by mouth daily.       ??? clonazePAM (KLONOPIN) 0.5 MG tablet Take 0.5 mg by  mouth 2 times daily as needed.       ??? aspirin 81 MG chewable tablet Take 81 mg by mouth daily.       ??? traZODone (DESYREL) 50 MG tablet Take 50 mg by mouth nightly.       ??? levothyroxine (LEVOTHROID) 75 MCG tablet Take 1 tablet by mouth daily.  90 tablet  3     No current facility-administered medications for this visit.        Allergies:  Tape     Review of Systems:   All 12 point review of symptoms completed. Pertinent positives identified in the HPI, all other review of symptoms negative as below.    Objective:   PHYSICAL EXAM:    There were no vitals filed for this visit.       Wt Readings from Last 3 Encounters:   08/17/12 142 lb 9.6 oz (64.683 kg)   08/03/12 144 lb 4.8 oz (65.454 kg)   11/23/11 144 lb (65.318 kg)         General Appearance:  Alert, cooperative, no distress, appears stated age   Head:  Normocephalic, atraumatic   Eyes:  PERRL, conjunctiva/corneas clear   Nose: Nares normal, no drainage or sinus  tenderness   Throat: Lips, mucosa, and tongue normal   Neck: Supple, symmetrical, trachea midline, NL thyroid no carotid bruit or JVD   Lungs:   CTAB, respirations unlabored   Chest Wall:  No tenderness or deformity   Heart:  Regular rhythm and normal rate; S1, S2 are normal;   no murmur noted; no rub or gallop   Abdomen:   Soft, non-tender, +BS x 4, no masses, no organomegaly   Extremities: Extremities normal, atraumatic, no cyanosis or edema, RT wrist with slight puffiness over radial artery but full strength, pulse, and sensation. No eccymosis or hematoma   Pulses: 2+ and symmetric   Skin: Skin color, texture, turgor normal,. Multiple ecchymosis on arms.   Pysch: Normal mood and affect   Neurologic: Normal gross motor and sensory exam.         LABS   CBC:      Lab Results   Component Value Date    WBC 5.1 08/18/2012    RBC 3.76 08/18/2012    HGB 11.4 08/18/2012    HCT 33.7 08/18/2012    MCV 89.6 08/18/2012    RDW 13.1 08/18/2012    PLT 231 08/18/2012     CMP:  Lab Results   Component Value Date    NA 131 08/07/2012    K 3.4 08/07/2012    CL 98 08/07/2012    CO2 24 08/07/2012    BUN 8 08/07/2012    CREATININE 0.6 08/07/2012    GFRAA >60 08/07/2012    GFRAA >60 06/26/2010    AGRATIO 2.4 08/03/2012    LABGLOM >60 08/07/2012    GLUCOSE 118 08/07/2012    PROT 6.1 08/18/2012    PROT 6.9 06/26/2010    CALCIUM 8.9 08/07/2012    BILITOT 0.3 08/18/2012    ALKPHOS 51 08/18/2012    AST 26 08/18/2012    ALT 25 08/18/2012     PT/INR:   No components found with this basename: PTPATIENT,  PTINR     Liver:  No components found with this basename: CHLPL     Lab Results   Component Value Date    ALT 25 08/18/2012    AST 26 08/18/2012    ALKPHOS 51 08/18/2012    BILITOT  0.3 08/18/2012     No results found for this basename: LABA1C     Lipids:         Lab Results   Component Value Date    TRIG 55 06/26/2010    TRIG 43 06/04/2008            Lab Results   Component Value Date    HDL 94* 06/26/2010    HDL 79* 06/04/2008            Lab Results   Component Value Date    LDLCALC 124*  06/26/2010    LDLCALC 104* 06/04/2008            Lab Results   Component Value Date    LABVLDL 11 06/26/2010    LABVLDL 9 06/04/2008         CARDIAC DATA   EKG:  08/04/2012  NSR, NSSt changes.     ECHO/MUGA: 08/04/12  Summary   Overall left ventricular function is normal. Ejection fraction is   visually estimated to be 55-60 %.   Mild concentric left ventricular hypertrophy is present.   Aortic valve appears sclerotic but opens adequately.   Systolic pulmonary artery pressure (SPAP) is normal and estimated at 36   mmHg (RA pressure 3 mmHg).      STRESS TEST: 08/03/12  Findings: Myocardial perfusion is grossly normal at both stress and rest. Left ventricular cavity size is normal and wall motion is uniform. The estimated ejection fraction is 57%.      CARDIAC CATH: 08/06/12  ANGIOGRAPHIC FINDINGS:  1.  Left main coronary artery has no angiographic disease, bifurcates into  the LAD and left circumflex.  2.  The LAD had an ostial 50% lesion.  This was checked by FFR and found to  be negative at 0.91.  The LAD itself was a very large vessel.  On the  midsection, there was an area of ectatic disease and lesions up to 80%. The  remainder of the LAD had luminal irregularities.  There was a large diagonal  vessel coming off the mid LAD just before that section of disease.  This was  the lesion that was stented across.  3.  Left circumflex had a small vessel that continued in the AV groove.    There was a very large obtuse marginal 1 branch that came off the side.  4.  The right coronary artery was a large, dominant vessel with luminal  irregularities.    5.  LVEDP was about 15.  6.  Left ventricular ejection fraction was 65%.    7.  No aortic stenosis.      ASSESSMENT:  Significant stenosis of the mid left anterior descending, status  post treatment with Xience Xpedition 3.5 x 23 mm drug-eluting stent with  kissing balloon inflations in a provisional fashion to the side branch  diagonal vessel.  There is 0% residual stenosis,  TIMI 3 flow.        PLAN:     1.  At this time, patient will continue overnight admission for observation  following intervention.  I will load her with Effient, continue on 10 mg  daily.  The patient is at the weight limit for Effient, so will have to watch  her carefully to ensure that she does not need 5 mg daily instead.  Will  continue Effient for a minimum of 1 year.    2.  Aspirin 81 mg daily for life.  3.  Aggressive risk factor  reduction and medical management.        Renal US: 08/06/12  Impressions Right Impression The right kidney measures 10 cm. The right renal vein is patent. There is no evidence to suggest renal artery stenosis. Left Impression The left kidney measure 11.2 cm. The left renal vein is patent. The distal renal artery was not seen due to severe bowel gas, within the limits of this exam there is no evidence to suggest renal artery stenosis.   Conclusions    Summary    Bilateral renal artery ultrasound imaging was performed. No evidence of  renal artery stenosis. Renal veins are patent.      Carotid Doppler: 09/03/2012  Summary      Minimal plaque was seen in the internal carotid arteries with an estimated   diameter reduction of <50% bilaterally.       Assessment and Plan   RIHAM POLYAKOV is a 79 y.o. female who presents today for the following problems:         1. CAD: + ST depression but normal perfusion on stress testing. Taken to cath lab that showed 2 LAD lesions The ostial lesion was negative by FFR and will medically manage, The mid LAD was 80% and treated with drug stent. CP now resolved   ~ Cont ASA 81mg  for life   ~ Effient 10mg  qday for 1 yr minimum, ok to change to plavix if bruising continues   ~ Cont lipitor    2. HTN: sudden elevation with heavy USE of NSAIDS for back pain              ~ No renal artery stenosis on imaging   ~ Doing better with controlled HTN after stopping hydralazine   ~ Will d/c Imdur as well (poly-pharmacy), pt to watch for CP or HTN and call    3.  Fatigue: Much better   ~ Cont cardiac rehab       Plan:  1. Continue aspirin and Effient.   2. Check BP daily at home. Keep a log to bring with you on follow up. Goal is less than 140/90.  3. Continue Metoprolol.  4. Discontinue Imdur, if chest pain re occurs encouraged patient to restart the medication.   5. She will follow up with me in 1 year.      It is a pleasure to assist in the care of [employer]. Please call with any questions.  All questions and concerns were addressed to the patient/family. Alternatives to my treatment were discussed. The note was completed using EMR. Every effort was made to ensure accuracy; however, inadvertent computerized transcription errors may be present.    Alicia Amel, MD, [facility]   Interventional Cardiologist  [facility]  [phone] [facility]  [phone] [facility] Office  09/17/2012  9:29 AM

## 2012-09-17 NOTE — Patient Instructions (Signed)
1. Continue aspirin and Effient.   2. Check BP daily at home. Keep a log to bring with you on follow up. Goal is less than 140/90.  3. Continue Metoprolol.  4. Discontinue Imdur, if chest pain re occurs encouraged patient to restart the medication.   5. She will follow up with me in 1 year.

## 2012-09-19 MED ORDER — LISINOPRIL 40 MG PO TABS
40 MG | ORAL_TABLET | Freq: Every day | ORAL | Status: AC
Start: 2012-09-19 — End: ?

## 2012-09-19 MED ORDER — ATORVASTATIN CALCIUM 20 MG PO TABS
20 MG | ORAL_TABLET | Freq: Every evening | ORAL | Status: AC
Start: 2012-09-19 — End: ?

## 2012-09-19 MED ORDER — PRASUGREL HCL 10 MG PO TABS
10 MG | ORAL_TABLET | Freq: Every day | ORAL | Status: DC
Start: 2012-09-19 — End: 2018-10-19

## 2012-09-19 NOTE — Telephone Encounter (Signed)
Please refill thank you Huntley DecSara    prasugrel (EFFIENT) 10 MG TABS      atorvastatin (LIPITOR) 20 MG tablet     lisinopril (PRINIVIL;ZESTRIL) 40 MG tablet   [facility] [place] [address] - P [phone] - F [phone]

## 2012-09-19 NOTE — Telephone Encounter (Signed)
Refills on medications sent to [facility].

## 2012-10-01 MED ORDER — NITROGLYCERIN 0.4 MG SL SUBL
0.4 MG | ORAL_TABLET | SUBLINGUAL | Status: AC | PRN
Start: 2012-10-01 — End: ?

## 2012-10-01 MED ORDER — NITROGLYCERIN 0.4 MG SL SUBL
0.4 MG | ORAL_TABLET | SUBLINGUAL | Status: DC | PRN
Start: 2012-10-01 — End: 2012-10-01

## 2012-10-01 NOTE — Telephone Encounter (Signed)
[phone], and got home and realized that she didn't have rx for Nitroglycerin.  [facility] didn't have script [facility].  Pt needs this called [facility] to [facility].

## 2012-10-01 NOTE — Telephone Encounter (Signed)
Script for nitro sent to [facility].

## 2012-10-16 MED ORDER — ISOSORBIDE MONONITRATE ER 30 MG PO TB24
30 MG | ORAL_TABLET | Freq: Every day | ORAL | Status: DC
Start: 2012-10-16 — End: 2012-11-05

## 2012-10-16 NOTE — Addendum Note (Signed)
Addended by: Harvie Bridge on: 10/16/2012 05:00 PM     Modules accepted: Level of Service

## 2012-10-16 NOTE — Progress Notes (Addendum)
[facility]   Cardiovascular Evaluation    PATIENT: Destiny Salinas  DATE: 10/16/2012  MRN: [id number]  CSN: [id number]  DOB: Jan 14, 1932      Primary Care Doctor: Janalyn Harder, MD  Reason for evaluation:   1 Month Follow-Up; Chest Pain; Hypertension; Coronary Artery Disease; and Fatigue      Subjective:   History of present illness on initial date of evaluation:   Destiny Salinas is a 79 y.o. patient who presents for hospital follow up. He presented to the ER with complaints of chest pain.  She had a history of hypertension and hypothyroidism.  She had a stress test that showed no ischemia by nuclear perfusion but did have 2 mm of ST depression.  This was in the setting of severe uncontrolled blood pressure with systolic greater than 200.  In the hospital, she continued to have anginal type chest pains.  Therefore, she was brought to the cardiac catheterization lab for evaluation of the above findings.   On 09/17/12 she reports she is doing well overall. She is back to riding her bicycle daily without difficulty. States her B/P has been stable at home. It does become elevated with high levels of stress at times.    Today she reports she woke up on Tuesday 2 weeks ago with severe pain under her left breast that went into her back. She reports since the episode of pain she has been fatigued and not feeling well. She states the last three she had epigastric pain that made her "feel sensitive" all over. She states the more active she is, the worse the pain is. She has to stop to rest. She denies fevers, chills, and N/V. She reports muscle aches in the back of her legs.     Patient Active Problem List   Diagnosis   ??? Abnormal mammogram   ??? Menopausal state   ??? Wound, open, leg   ??? Edema   ??? Chest pain   ??? HTN (hypertension)   ??? Hypothyroid   ??? Hypokalemia   ??? Hypertensive urgency   ??? CAD (coronary artery disease)         Past Medical History:   has a past medical history of Thyroid disease;  Psychiatric problem; Hypertension; Urinary frequency; and CAD (coronary artery disease).    Surgical History:   has past surgical history that includes fracture surgery; Knee arthroscopy; Appendectomy; Tonsillectomy; Colonoscopy (2006); eye surgery; Hysterectomy; and Coronary angioplasty with stent (08/06/2012).     Social History:   reports that she has never smoked. She has never used smokeless tobacco. She reports that she drinks about 8.4 ounces of alcohol per week. She reports that she does not use illicit drugs.     Family History:  No evidence for sudden cardiac death or premature CAD    Home Medications:  Reviewed and are listed in nursing record. and/or listed below  Current Outpatient Prescriptions   Medication Sig Dispense Refill   ??? nitroGLYCERIN (NITROSTAT) 0.4 MG SL tablet Place 1 tablet under the tongue every 5 minutes as needed for Chest pain.  25 tablet  3   ??? prasugrel (EFFIENT) 10 MG TABS Take 1 tablet by mouth daily.  90 tablet  3   ??? atorvastatin (LIPITOR) 20 MG tablet Take 1 tablet by mouth nightly.  90 tablet  1   ??? lisinopril (PRINIVIL;ZESTRIL) 40 MG tablet Take 1 tablet by mouth daily.  90 tablet  1   ???  metoprolol (TOPROL XL) 25 MG XL tablet Take 25 mg by mouth 2 times daily.       ??? ibuprofen (ADVIL;MOTRIN) 200 MG tablet Take 600 mg by mouth every 6 hours as needed.       ??? oxybutynin (DITROPAN-XL) 10 MG CR tablet Take 10 mg by mouth daily.       ??? clonazePAM (KLONOPIN) 0.5 MG tablet Take 0.5 mg by mouth 2 times daily as needed.       ??? aspirin 81 MG chewable tablet Take 81 mg by mouth daily.       ??? traZODone (DESYREL) 50 MG tablet Take 50 mg by mouth nightly.       ??? levothyroxine (LEVOTHROID) 75 MCG tablet Take 1 tablet by mouth daily.  90 tablet  3     No current facility-administered medications for this visit.        Allergies:  Tape     Review of Systems:   All 12 point review of symptoms completed. Pertinent positives identified in the HPI, all other review of symptoms negative as  below.    Objective:   PHYSICAL EXAM:    Filed Vitals:    10/16/12 1533   BP: 130/86   Pulse: 51    Weight: 142 lb 9.6 oz (64.683 kg)     Wt Readings from Last 3 Encounters:   10/16/12 142 lb 9.6 oz (64.683 kg)   09/17/12 140 lb 6.4 oz (63.685 kg)   08/17/12 142 lb 9.6 oz (64.683 kg)         General Appearance:  Alert, cooperative, no distress, appears stated age   Head:  Normocephalic, atraumatic   Eyes:  PERRL, conjunctiva/corneas clear   Nose: Nares normal, no drainage or sinus tenderness   Throat: Lips, mucosa, and tongue normal   Neck: Supple, symmetrical, trachea midline, NL thyroid no carotid bruit or JVD   Lungs:   CTAB, respirations unlabored   Chest Wall:  No tenderness or deformity   Heart:  Regular rhythm and normal rate; S1, S2 are normal;   no murmur noted; no rub or gallop   Abdomen:   Soft, non-tender, +BS x 4, no masses, no organomegaly   Extremities: Extremities normal, atraumatic, no cyanosis or edema, RT wrist with slight puffiness over radial artery but full strength, pulse, and sensation. No eccymosis or hematoma   Pulses: 2+ and symmetric   Skin: Skin color, texture, turgor normal,. Multiple ecchymosis on arms.   Pysch: Normal mood and affect   Neurologic: Normal gross motor and sensory exam.         LABS   CBC:      Lab Results   Component Value Date    WBC 5.1 08/18/2012    RBC 3.76 08/18/2012    HGB 11.4 08/18/2012    HCT 33.7 08/18/2012    MCV 89.6 08/18/2012    RDW 13.1 08/18/2012    PLT 231 08/18/2012     CMP:  Lab Results   Component Value Date    NA 131 08/07/2012    K 3.4 08/07/2012    CL 98 08/07/2012    CO2 24 08/07/2012    BUN 8 08/07/2012    CREATININE 0.6 08/07/2012    GFRAA >60 08/07/2012    GFRAA >60 06/26/2010    AGRATIO 2.4 08/03/2012    LABGLOM >60 08/07/2012    GLUCOSE 118 08/07/2012    PROT 6.1 08/18/2012    PROT 6.9 06/26/2010  CALCIUM 8.9 08/07/2012    BILITOT 0.3 08/18/2012    ALKPHOS 51 08/18/2012    AST 26 08/18/2012    ALT 25 08/18/2012     PT/INR:   No components found with this basename: PTPATIENT,   PTINR     Liver:  No components found with this basename: CHLPL     Lab Results   Component Value Date    ALT 25 08/18/2012    AST 26 08/18/2012    ALKPHOS 51 08/18/2012    BILITOT 0.3 08/18/2012     No results found for this basename: LABA1C     Lipids:         Lab Results   Component Value Date    TRIG 55 06/26/2010    TRIG 43 06/04/2008            Lab Results   Component Value Date    HDL 94* 06/26/2010    HDL 79* 06/04/2008            Lab Results   Component Value Date    LDLCALC 124* 06/26/2010    LDLCALC 104* 06/04/2008            Lab Results   Component Value Date    LABVLDL 11 06/26/2010    LABVLDL 9 06/04/2008         CARDIAC DATA   EKG:  08/04/2012  NSR, NSSt changes.     ECHO/MUGA: 08/04/12  Summary   Overall left ventricular function is normal. Ejection fraction is   visually estimated to be 55-60 %.   Mild concentric left ventricular hypertrophy is present.   Aortic valve appears sclerotic but opens adequately.   Systolic pulmonary artery pressure (SPAP) is normal and estimated at 36   mmHg (RA pressure 3 mmHg).      STRESS TEST: 08/03/12  Findings: Myocardial perfusion is grossly normal at both stress and rest. Left ventricular cavity size is normal and wall motion is uniform. The estimated ejection fraction is 57%.      CARDIAC CATH: 08/06/12  ANGIOGRAPHIC FINDINGS:  1.  Left main coronary artery has no angiographic disease, bifurcates into  the LAD and left circumflex.  2.  The LAD had an ostial 50% lesion.  This was checked by FFR and found to  be negative at 0.91.  The LAD itself was a very large vessel.  On the  midsection, there was an area of ectatic disease and lesions up to 80%. The  remainder of the LAD had luminal irregularities.  There was a large diagonal  vessel coming off the mid LAD just before that section of disease.  This was  the lesion that was stented across.  3.  Left circumflex had a small vessel that continued in the AV groove.    There was a very large obtuse marginal 1 branch that came off  the side.  4.  The right coronary artery was a large, dominant vessel with luminal  irregularities.    5.  LVEDP was about 15.  6.  Left ventricular ejection fraction was 65%.    7.  No aortic stenosis.      ASSESSMENT:  Significant stenosis of the mid left anterior descending, status  post treatment with Xience Xpedition 3.5 x 23 mm drug-eluting stent with  kissing balloon inflations in a provisional fashion to the side branch  diagonal vessel.  There is 0% residual stenosis, TIMI 3 flow.        PLAN:  1.  At this time, patient will continue overnight admission for observation  following intervention.  I will load her with Effient, continue on 10 mg  daily.  The patient is at the weight limit for Effient, so will have to watch  her carefully to ensure that she does not need 5 mg daily instead.  Will  continue Effient for a minimum of 1 year.    2.  Aspirin 81 mg daily for life.  3.  Aggressive risk factor reduction and medical management.        Renal US: 08/06/12  Impressions Right Impression The right kidney measures 10 cm. The right renal vein is patent. There is no evidence to suggest renal artery stenosis. Left Impression The left kidney measure 11.2 cm. The left renal vein is patent. The distal renal artery was not seen due to severe bowel gas, within the limits of this exam there is no evidence to suggest renal artery stenosis.   Conclusions    Summary    Bilateral renal artery ultrasound imaging was performed. No evidence of  renal artery stenosis. Renal veins are patent.      Carotid Doppler: 09/03/2012  Summary      Minimal plaque was seen in the internal carotid arteries with an estimated   diameter reduction of <50% bilaterally.       Assessment and Plan   Destiny Salinas is a 79 y.o. female who presents today for the following problems:         1. CAD: + ST depression but normal perfusion on stress testing. Taken to cath lab that showed 2 LAD lesions The ostial lesion was negative by FFR and will  medically manage, The mid LAD was 80%  And just past a diagonal. This was tested with stent to mid LAD and kissing provisional balloon to diagonal vessel. Reviewed cath images and final picture looked excellent, new vague symptoms that are uncertain for CAD.   ~ Cont ASA 81mg  for life   ~ Effient 10mg  qday for 1 yr minimum, ok to change to plavix if bruising continues   ~ Cont lipitor   ~ Will restart imdur and d/c lipitor and see if symptoms iprove   ~ pt to call in 2 weeks if not better- if persists, will need repeat cath.    2. HTN: Now well controlled.              ~ No renal artery stenosis on imaging   ~ Doing better with controlled HTN after stopping hydralazine    3. Fatigue ad myalgias:   ~ Trial stopping lipitor.       Plan:  1. Restart Imdur 30 mg daily for two weeks   2. Stop Lipitor for 2 weeks  3. If symptoms do not improve, call the office [phone]. May schedule cardiac cath  4. Follow up in 1 months     It is a pleasure to assist in the care of [employer]. Please call with any questions.  All questions and concerns were addressed to the patient/family. Alternatives to my treatment were discussed. The note was completed using EMR. Every effort was made to ensure accuracy; however, inadvertent computerized transcription errors may be present.    Alicia Amel, MD, [facility]   Interventional Cardiologist  [facility]  [phone] [facility]  [phone] [facility] Office  10/16/2012  3:59 PM

## 2012-10-16 NOTE — Patient Instructions (Signed)
Plan:  1. Restart Imdur 30 mg daily for two weeks   2. Stop Lipitor for 2 weeks  3. If symptoms do not improve, call the office [phone]. May schedule cardiac cath  4. Follow up in 1 months

## 2012-11-02 ENCOUNTER — Encounter

## 2012-11-05 MED ORDER — ISOSORBIDE MONONITRATE ER 30 MG PO TB24
30 MG | ORAL_TABLET | Freq: Every day | ORAL | Status: DC
Start: 2012-11-05 — End: 2012-11-05

## 2012-11-05 MED ORDER — ISOSORBIDE MONONITRATE ER 30 MG PO TB24
30 MG | ORAL_TABLET | Freq: Every day | ORAL | Status: DC
Start: 2012-11-05 — End: 2018-10-19

## 2012-11-05 NOTE — Telephone Encounter (Signed)
Pt signed a medical release form this am. She did not have the info when she was here.     Dr. Devona Konig  [facility] Specialist [facility]  [address]  [address]  [place], [place]  Phone: [phone]  Fax: [phone]  ATTN: Tawanna Cooler

## 2012-11-05 NOTE — Progress Notes (Signed)
[facility]   Cardiovascular Evaluation    PATIENT: Destiny Salinas  DATE: 11/05/2012  MRN: [id number]  CSN: [id number]  DOB: 1932/09/01      Primary Care Doctor: Janalyn Harder, MD  Reason for evaluation:   1 Month Follow-Up; Coronary Artery Disease; Hypertension; Shortness of Breath; and Fatigue      Subjective:   History of present illness on initial date of evaluation:   JOETTE Salinas is a 79 y.o. patient who presents for hospital follow up. He presented to the ER with complaints of chest pain.  She had a history of hypertension and hypothyroidism.  She had a stress test that showed no ischemia by nuclear perfusion but did have 2 mm of ST depression.  This was in the setting of severe uncontrolled blood pressure with systolic greater than 200.  In the hospital, she continued to have anginal type chest pains.  Therefore, she was brought to the cardiac catheterization lab for evaluation of the above findings.   On 09/17/12 she reports she is doing well overall. She is back to riding her bicycle daily without difficulty. States her B/P has been stable at home. It does become elevated with high levels of stress at times.    On 10/16/12 she reported she woke up on Tuesday 2 weeks ago with severe pain under her left breast that went into her back. She reports since the episode of pain she has been fatigued and not feeling well. She states the last three she had epigastric pain that made her "feel sensitive" all over. She states the more active she is, the worse the pain is. She has to stop to rest. She denies fevers, chills, and N/V. She reports muscle aches in the back of her legs.    Today she reports she has increased fatigue but believes it is due to over work. She is packing up to move to [place] for the winter. She reports occasional SOB. She denies CP, palpitations, dizziness, edema or syncope. She states it takes her longer to recover from activities than before.     Patient Active  Problem List   Diagnosis   ??? Abnormal mammogram   ??? Menopausal state   ??? Wound, open, leg   ??? Edema   ??? Chest pain   ??? HTN (hypertension)   ??? Hypothyroid   ??? Hypokalemia   ??? Hypertensive urgency   ??? CAD (coronary artery disease)         Past Medical History:   has a past medical history of Thyroid disease; Psychiatric problem; Hypertension; Urinary frequency; and CAD (coronary artery disease).    Surgical History:   has past surgical history that includes fracture surgery; Knee arthroscopy; Appendectomy; Tonsillectomy; Colonoscopy (2006); eye surgery; Hysterectomy; and Coronary angioplasty with stent (08/06/2012).     Social History:   reports that she has never smoked. She has never used smokeless tobacco. She reports that she drinks about 8.4 ounces of alcohol per week. She reports that she does not use illicit drugs.     Family History:  No evidence for sudden cardiac death or premature CAD    Home Medications:  Reviewed and are listed in nursing record. and/or listed below  Current Outpatient Prescriptions   Medication Sig Dispense Refill   ??? isosorbide mononitrate (IMDUR) 30 MG CR tablet Take 1 tablet by mouth daily.  30 tablet  3   ??? nitroGLYCERIN (NITROSTAT) 0.4 MG SL tablet Place 1  tablet under the tongue every 5 minutes as needed for Chest pain.  25 tablet  3   ??? prasugrel (EFFIENT) 10 MG TABS Take 1 tablet by mouth daily.  90 tablet  3   ??? atorvastatin (LIPITOR) 20 MG tablet Take 1 tablet by mouth nightly.  90 tablet  1   ??? lisinopril (PRINIVIL;ZESTRIL) 40 MG tablet Take 1 tablet by mouth daily.  90 tablet  1   ??? metoprolol (TOPROL XL) 25 MG XL tablet Take 25 mg by mouth 2 times daily.       ??? ibuprofen (ADVIL;MOTRIN) 200 MG tablet Take 600 mg by mouth every 6 hours as needed.       ??? oxybutynin (DITROPAN-XL) 10 MG CR tablet Take 10 mg by mouth daily.       ??? clonazePAM (KLONOPIN) 0.5 MG tablet Take 0.5 mg by mouth 2 times daily as needed.       ??? aspirin 81 MG chewable tablet Take 81 mg by mouth daily.        ??? traZODone (DESYREL) 50 MG tablet Take 50 mg by mouth nightly.       ??? levothyroxine (LEVOTHROID) 75 MCG tablet Take 1 tablet by mouth daily.  90 tablet  3     No current facility-administered medications for this visit.        Allergies:  Tape     Review of Systems:   All 12 point review of symptoms completed. Pertinent positives identified in the HPI, all other review of symptoms negative as below.    Objective:   PHYSICAL EXAM:    Filed Vitals:    11/05/12 0954   BP: 136/74   Pulse: 56    Weight: 143 lb (64.864 kg)     Wt Readings from Last 3 Encounters:   11/05/12 143 lb (64.864 kg)   10/16/12 142 lb 9.6 oz (64.683 kg)   09/17/12 140 lb 6.4 oz (63.685 kg)         General Appearance:  Alert, cooperative, no distress, appears stated age   Head:  Normocephalic, atraumatic   Eyes:  PERRL, conjunctiva/corneas clear   Nose: Nares normal, no drainage or sinus tenderness   Throat: Lips, mucosa, and tongue normal   Neck: Supple, symmetrical, trachea midline, NL thyroid no carotid bruit or JVD   Lungs:   CTAB, respirations unlabored   Chest Wall:  No tenderness or deformity   Heart:  Regular rhythm and normal rate; S1, S2 are normal;   no murmur noted; no rub or gallop   Abdomen:   Soft, non-tender, +BS x 4, no masses, no organomegaly   Extremities: Extremities normal, atraumatic, no cyanosis or edema, RT wrist with slight puffiness over radial artery but full strength, pulse, and sensation. No eccymosis or hematoma   Pulses: 2+ and symmetric   Skin: Skin color, texture, turgor normal,. Multiple ecchymosis on arms.   Pysch: Normal mood and affect   Neurologic: Normal gross motor and sensory exam.         LABS   CBC:      Lab Results   Component Value Date    WBC 5.1 08/18/2012    RBC 3.76 08/18/2012    HGB 11.4 08/18/2012    HCT 33.7 08/18/2012    MCV 89.6 08/18/2012    RDW 13.1 08/18/2012    PLT 231 08/18/2012     CMP:  Lab Results   Component Value Date    NA 131 08/07/2012  K 3.4 08/07/2012    CL 98 08/07/2012    CO2 24 08/07/2012     BUN 8 08/07/2012    CREATININE 0.6 08/07/2012    GFRAA >60 08/07/2012    GFRAA >60 06/26/2010    AGRATIO 2.4 08/03/2012    LABGLOM >60 08/07/2012    GLUCOSE 118 08/07/2012    PROT 6.1 08/18/2012    PROT 6.9 06/26/2010    CALCIUM 8.9 08/07/2012    BILITOT 0.3 08/18/2012    ALKPHOS 51 08/18/2012    AST 26 08/18/2012    ALT 25 08/18/2012     PT/INR:   No components found with this basename: PTPATIENT,  PTINR     Liver:  No components found with this basename: CHLPL     Lab Results   Component Value Date    ALT 25 08/18/2012    AST 26 08/18/2012    ALKPHOS 51 08/18/2012    BILITOT 0.3 08/18/2012     No results found for this basename: LABA1C     Lipids:         Lab Results   Component Value Date    TRIG 55 06/26/2010    TRIG 43 06/04/2008            Lab Results   Component Value Date    HDL 94* 06/26/2010    HDL 79* 06/04/2008            Lab Results   Component Value Date    LDLCALC 124* 06/26/2010    LDLCALC 104* 06/04/2008            Lab Results   Component Value Date    LABVLDL 11 06/26/2010    LABVLDL 9 06/04/2008         CARDIAC DATA   EKG:  08/04/2012  NSR, NSSt changes.     ECHO/MUGA: 08/04/12  Summary   Overall left ventricular function is normal. Ejection fraction is   visually estimated to be 55-60 %.   Mild concentric left ventricular hypertrophy is present.   Aortic valve appears sclerotic but opens adequately.   Systolic pulmonary artery pressure (SPAP) is normal and estimated at 36   mmHg (RA pressure 3 mmHg).      STRESS TEST: 08/03/12  Findings: Myocardial perfusion is grossly normal at both stress and rest. Left ventricular cavity size is normal and wall motion is uniform. The estimated ejection fraction is 57%.      CARDIAC CATH: 08/06/12  ANGIOGRAPHIC FINDINGS:  1.  Left main coronary artery has no angiographic disease, bifurcates into  the LAD and left circumflex.  2.  The LAD had an ostial 50% lesion.  This was checked by FFR and found to  be negative at 0.91.  The LAD itself was a very large vessel.  On the  midsection, there was  an area of ectatic disease and lesions up to 80%. The  remainder of the LAD had luminal irregularities.  There was a large diagonal  vessel coming off the mid LAD just before that section of disease.  This was  the lesion that was stented across.  3.  Left circumflex had a small vessel that continued in the AV groove.    There was a very large obtuse marginal 1 branch that came off the side.  4.  The right coronary artery was a large, dominant vessel with luminal  irregularities.    5.  LVEDP was about 15.  6.  Left ventricular ejection  fraction was 65%.    7.  No aortic stenosis.      ASSESSMENT:  Significant stenosis of the mid left anterior descending, status  post treatment with Xience Xpedition 3.5 x 23 mm drug-eluting stent with  kissing balloon inflations in a provisional fashion to the side branch  diagonal vessel.  There is 0% residual stenosis, TIMI 3 flow.        PLAN:     1.  At this time, patient will continue overnight admission for observation  following intervention.  I will load her with Effient, continue on 10 mg  daily.  The patient is at the weight limit for Effient, so will have to watch  her carefully to ensure that she does not need 5 mg daily instead.  Will  continue Effient for a minimum of 1 year.    2.  Aspirin 81 mg daily for life.  3.  Aggressive risk factor reduction and medical management.        Renal US: 08/06/12  Impressions Right Impression The right kidney measures 10 cm. The right renal vein is patent. There is no evidence to suggest renal artery stenosis. Left Impression The left kidney measure 11.2 cm. The left renal vein is patent. The distal renal artery was not seen due to severe bowel gas, within the limits of this exam there is no evidence to suggest renal artery stenosis.   Conclusions    Summary    Bilateral renal artery ultrasound imaging was performed. No evidence of  renal artery stenosis. Renal veins are patent.      Carotid Doppler: 09/03/2012  Summary      Minimal  plaque was seen in the internal carotid arteries with an estimated   diameter reduction of <50% bilaterally.       Assessment and Plan   ASHLYNNE SHETTERLY is a 79 y.o. female who presents today for the following problems:         1. CAD: + ST depression but normal perfusion on stress testing. Taken to cath lab that showed 2 LAD lesions-->The ostial lesion was negative by FFR and will manage medically, The mid LAD was 80% at a bifurcation and treated with stent to mid LAD and kissing provisional balloon to diagonal vessel. Reviewed cath images and final picture looked excellent, new vague symptoms that are uncertain for CAD. Very Rare CP   ~ Cont ASA 81mg  for life   ~ Effient 10mg  qday for 1 yr minimum, ok to change to plavix if bruising continues   ~ Cont lipitor   ~ CP better on Imdur    2. HTN: Now well controlled.              ~ No renal artery stenosis on imaging   ~ Doing better with controlled HTN after stopping hydralazine    3. Fatigue and myalgias:   ~ Trial stopping lipitor was no effect   ~ ? Slow to recover. Normal LVEF   ~ f/u with PCP, contin cardiac rehab    --> pt to f/u with new cardiologist in [place]     Plan:  1. No changes warranted at this time  2. Follow up with me as needed     It is a pleasure to assist in the care of [employer]. Please call with any questions.  All questions and concerns were addressed to the patient/family. Alternatives to my treatment were discussed. The note was completed using EMR. Every effort was  made to ensure accuracy; however, inadvertent computerized transcription errors may be present.    Alicia Amel, MD, [facility]   Interventional Cardiologist  [facility]  [phone] [facility]  [phone] [facility] Office  11/05/2012  10:30 AM

## 2012-11-06 NOTE — Telephone Encounter (Signed)
Thanks, will complete the release form-Kathy

## 2012-11-29 NOTE — Telephone Encounter (Signed)
Destiny Salinas is needing all her cardiac workup records sent to her new PCP in [place], Dr. Dwan Bolt. Fax: [phone], [phone]. If pt needs to be reached please call her at the mobile number.

## 2014-01-25 IMAGING — MG MAMMOGRAPHY SCREENING BILATERAL 3D TOMOSYNTHESIS WITH CAD
12 of 16 series · 12 of 32 positions shown · non-contrast
Comparison: None available
BREAST DENSITY: (Level C) The breasts are heterogeneously dense, which may
obscure small masses.

MAMMOGRAPHY SCREENING 3D TOMOSYNTHESIS WITH CAD, 01/25/2014 [DATE]:
CLINICAL INDICATION: Screening
TECHNIQUE: Digital mammograms and 3-D Tomosynthesis were obtained. These were
interpreted both primarily and with the aid of computer-aided detection
system.

[R CC]
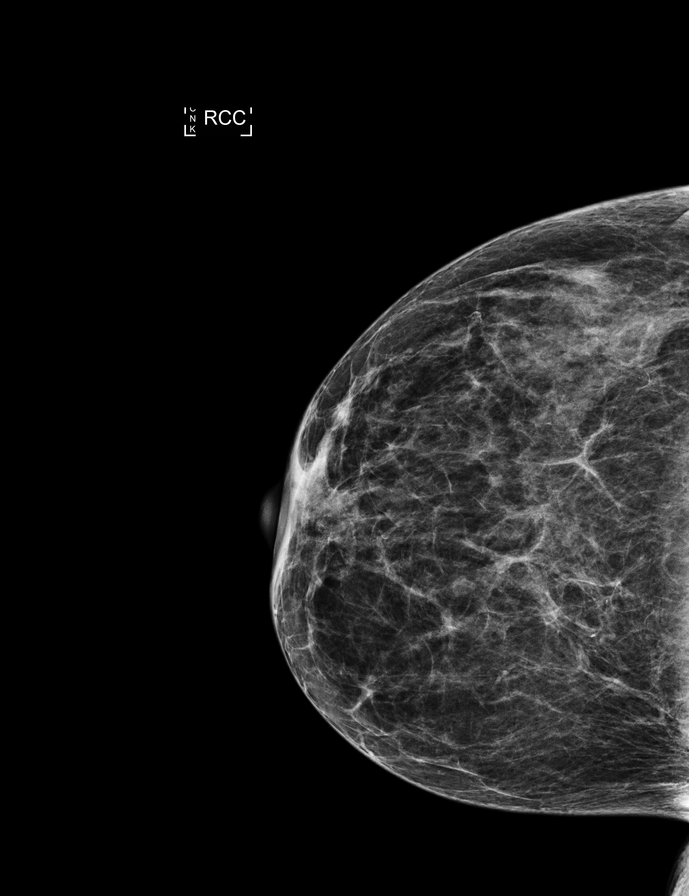

[L CC synth-2D]
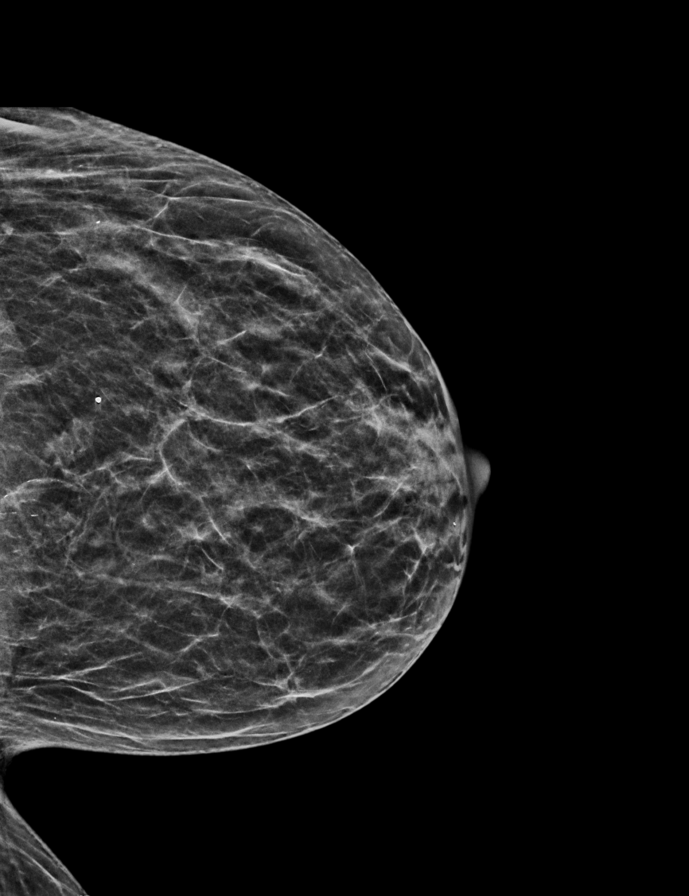

[L MLO]
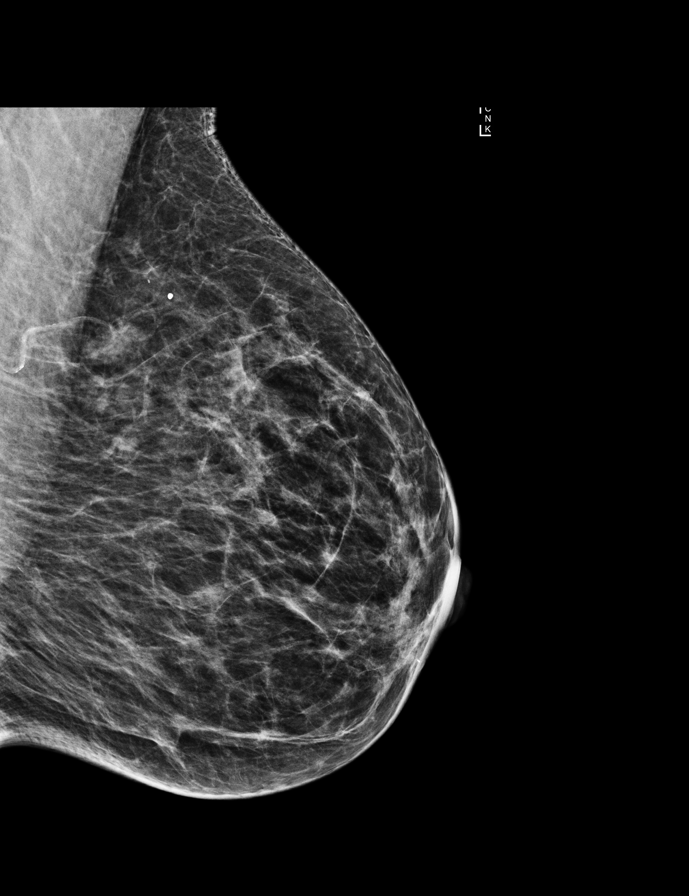

[R MLO]
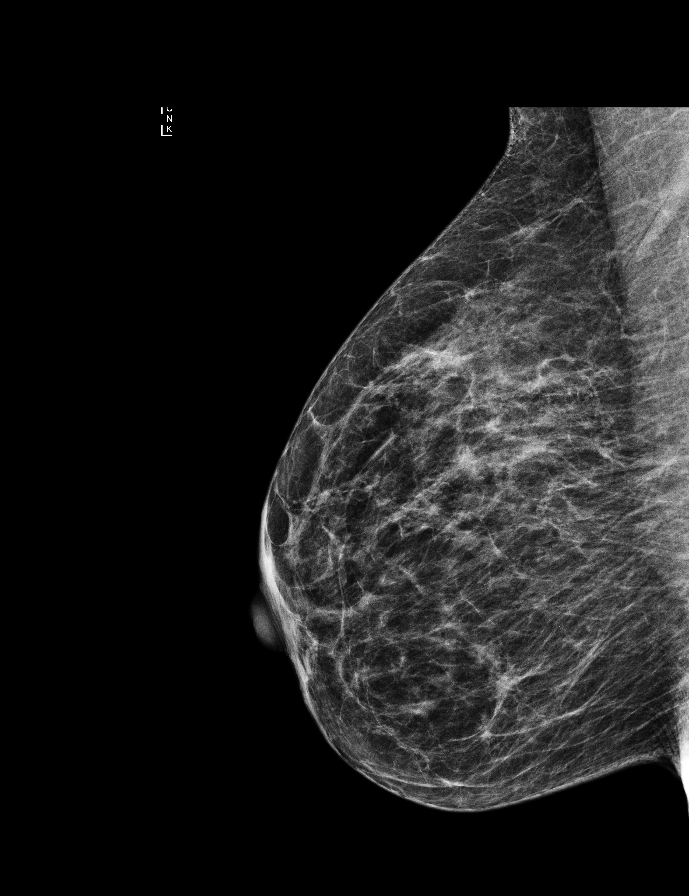

[L MLO synth-2D]
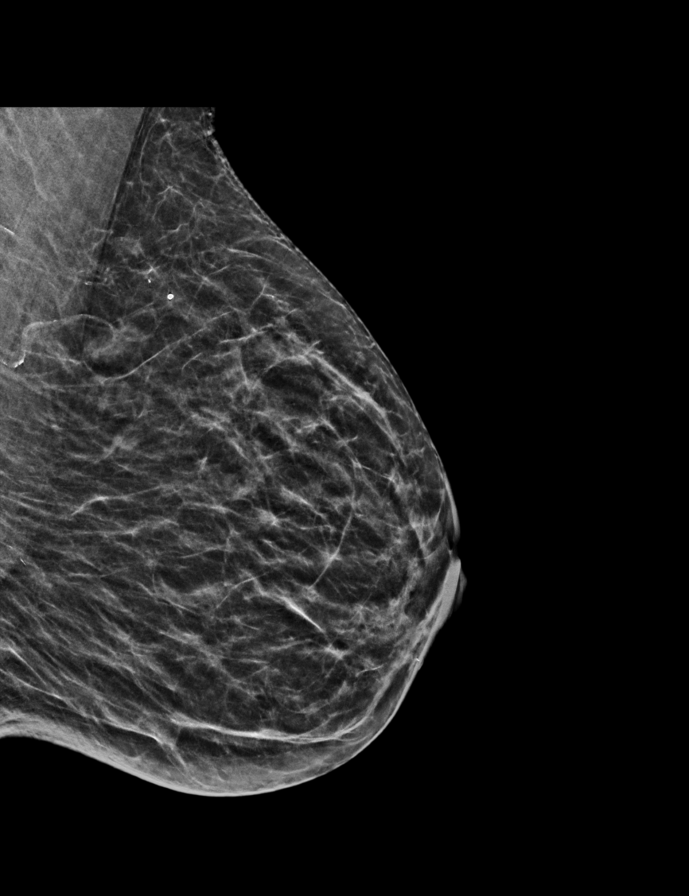

[L CC]
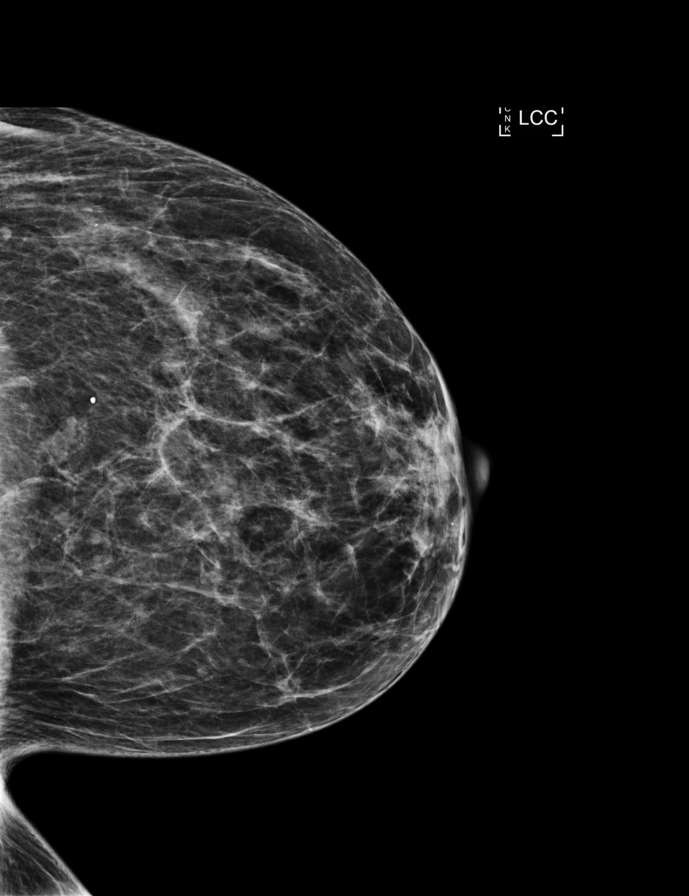

[R CC synth-2D]
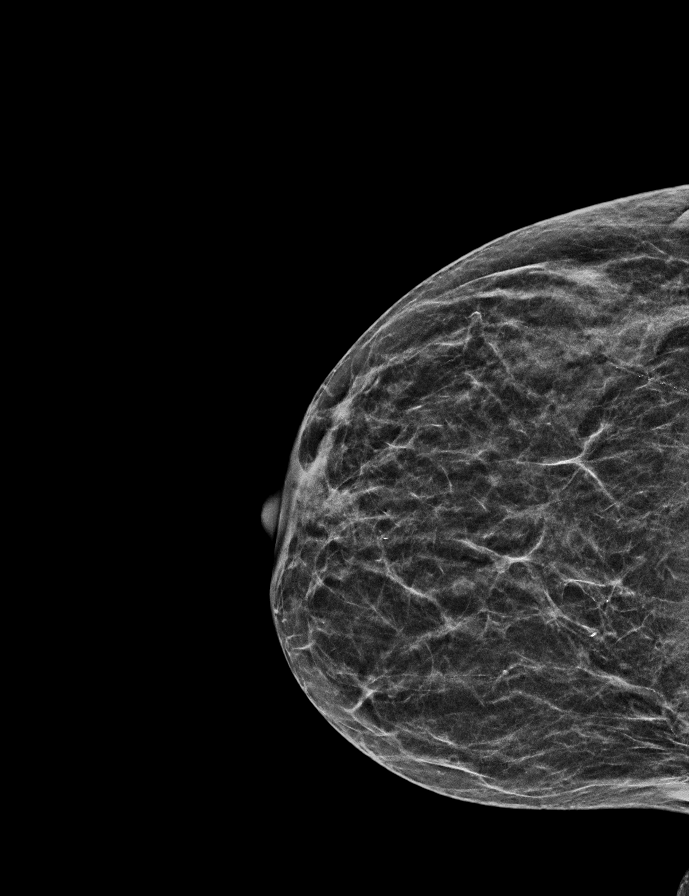

[R MLO synth-2D]
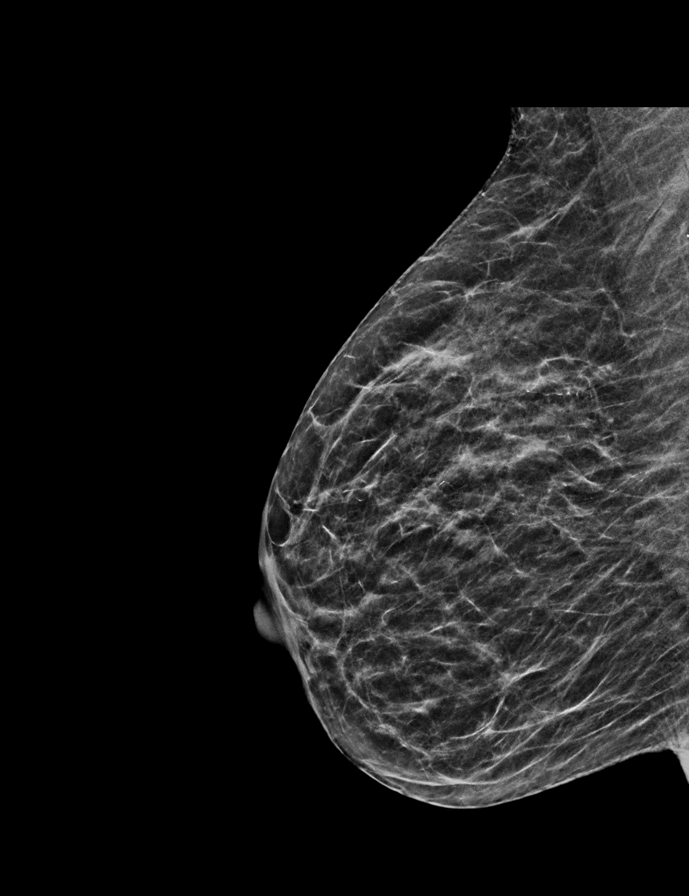

[L MLO tomo]
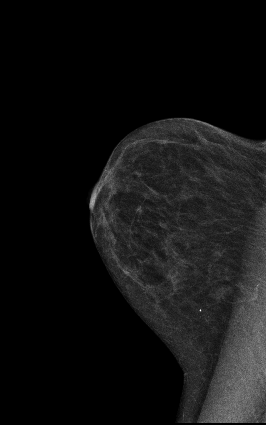

[R CC tomo]
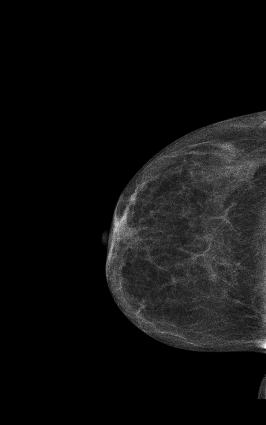

[R MLO tomo]
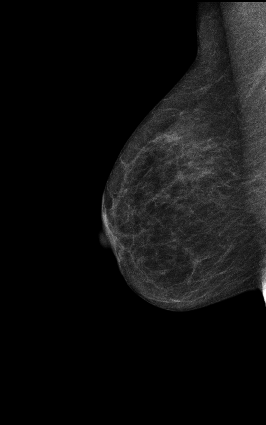

[L CC tomo]
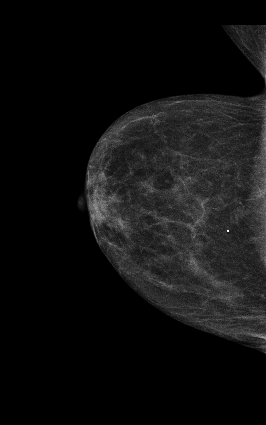

[12 of 32 positions shown; findings below may reference images not displayed]

FINDINGS: Scattered fibroglandular densities are seen bilaterally. Benign
calcifications are identified. No mass or malignant-appearing
microcalcifications are seen. No evidence of skin thickening.
IMPRESSION: ( BI-RADS 2) Benign findings. Routine mammographic follow-up is recommended.

## 2015-03-25 IMAGING — MG MAMMOGRAPHY SCREENING BILATERAL 3D TOMOSYNTHESIS WITH CAD
12 of 16 series · 12 of 32 positions shown · non-contrast
Comparison: January 25, 2014
BREAST DENSITY: (Level C) The breasts are heterogeneously dense, which may
obscure small masses.

MAMMOGRAPHY SCREENING 3D TOMOSYNTHESIS WITH CAD, 03/25/2015 [DATE]:
CLINICAL INDICATION: Screening
TECHNIQUE: Digital mammograms and 3-D Tomosynthesis were obtained. These were
interpreted both primarily and with the aid of computer-aided detection
system.

[L CC]
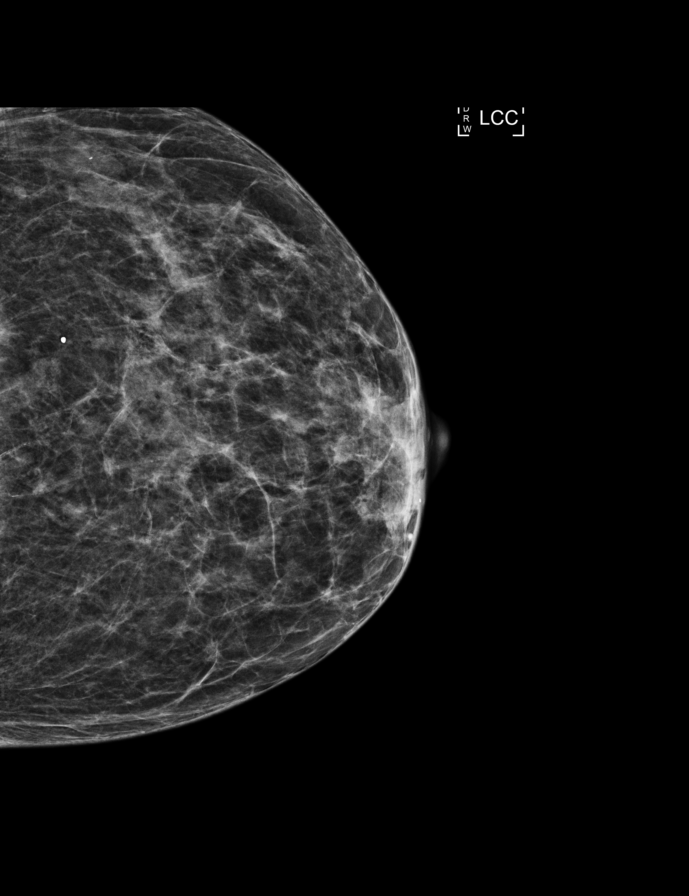

[R MLO]
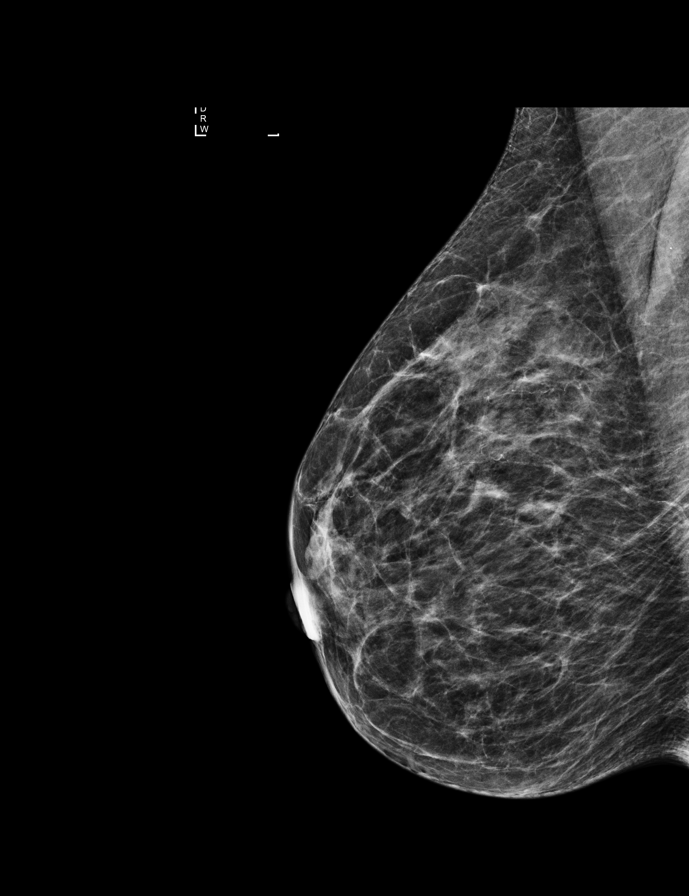

[L MLO]
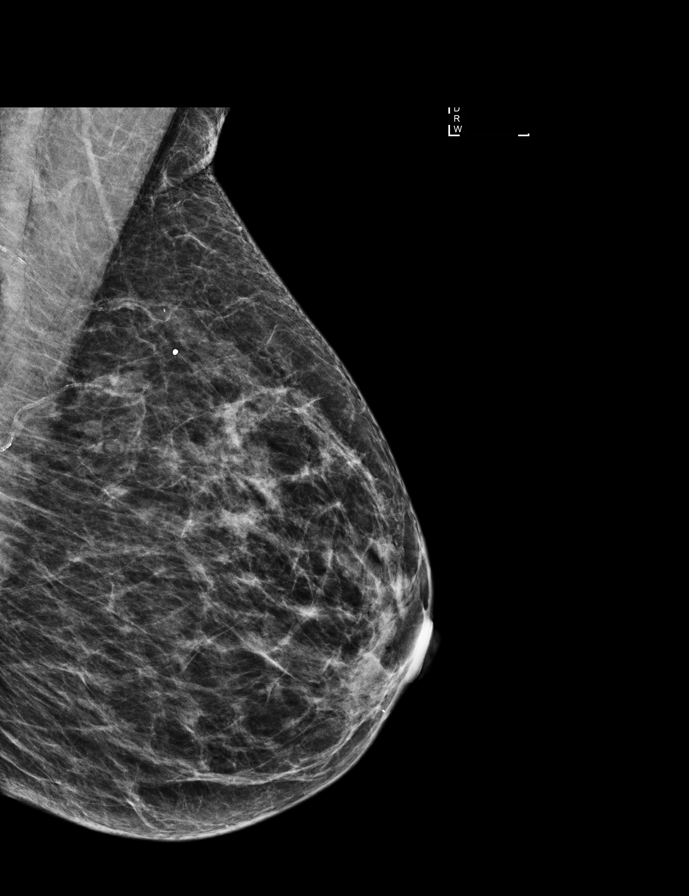

[R CC synth-2D]
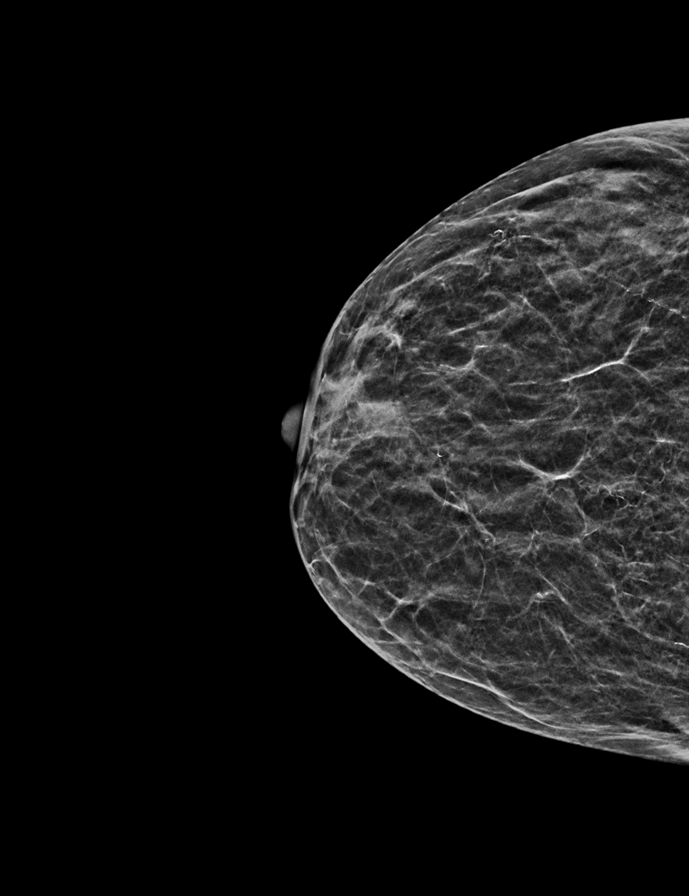

[R CC]
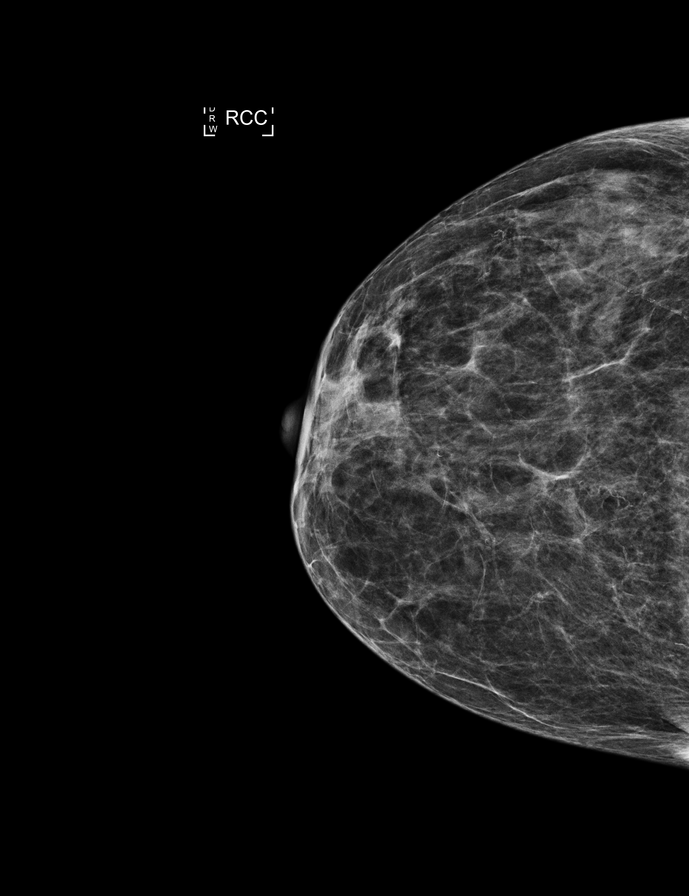

[R MLO synth-2D]
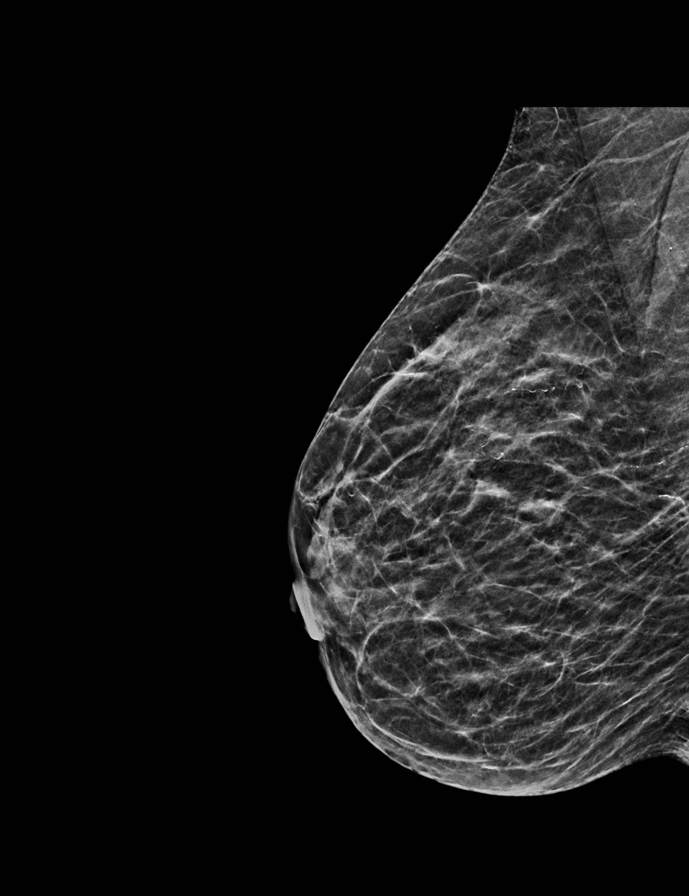

[L MLO synth-2D]
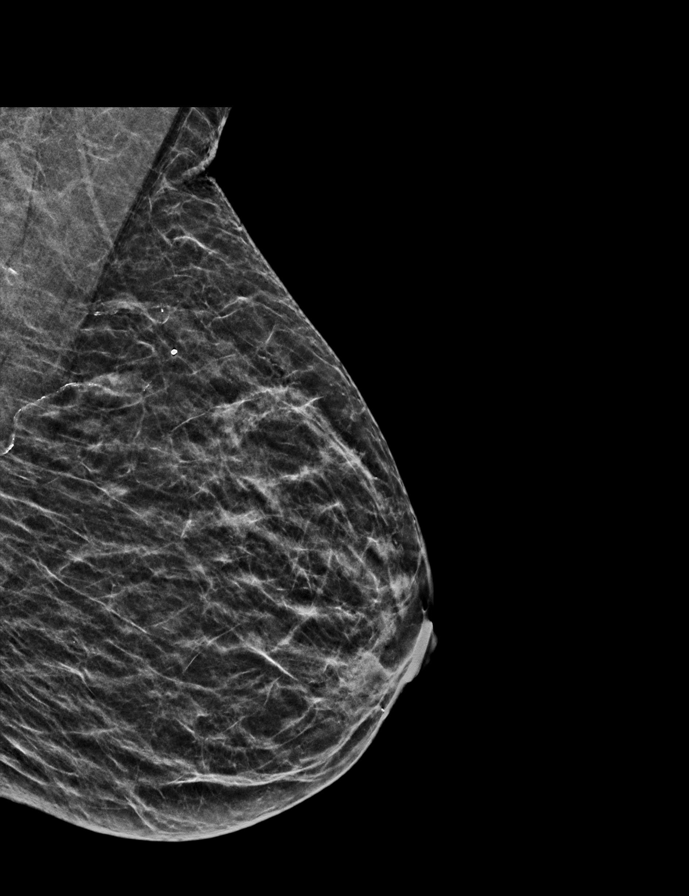

[L CC synth-2D]
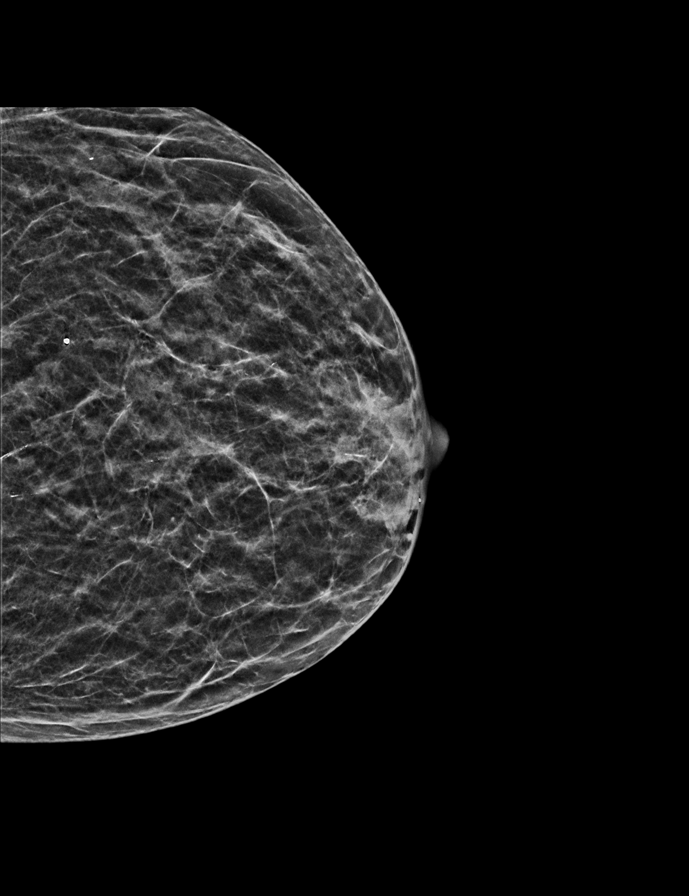

[R CC tomo]
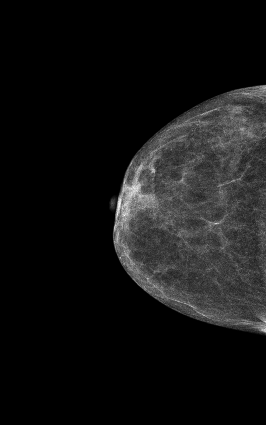

[L CC tomo]
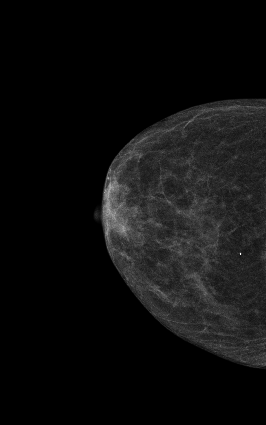

[L MLO tomo]
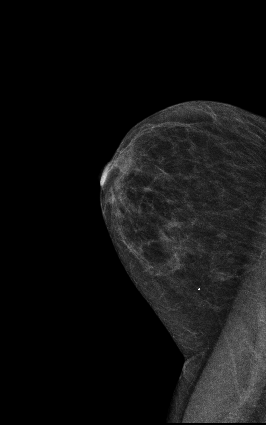

[R MLO tomo]
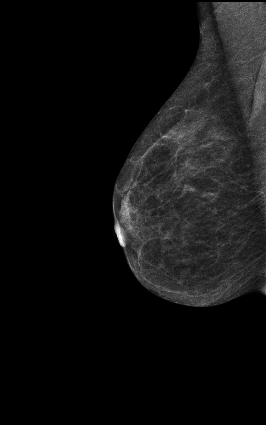

[12 of 32 positions shown; findings below may reference images not displayed]

FINDINGS: There is no evidence of mass or malignant appearing
microcalcifications
seen on today's examination. No skin thickening is identified. There is
thought
to be an overall stable mammographic appearance.
IMPRESSION: ( BI-RADS 2) Benign findings. Routine mammographic follow-up is recommended.

## 2016-06-17 IMAGING — MG MAMMOGRAPHY SCREENING BILATERAL 3D TOMOSYNTHESIS WITH CAD
12 series · 12 of 28 positions shown · non-contrast
Comparison: Exams dating back to 2125 
BREAST DENSITY: (Level B) There are scattered areas of fibroglandular density.

MAMMOGRAPHY SCREENING BILATERAL 3D TOMOSYNTHESIS WITH CAD, 06/17/2016 [DATE]: 
CLINICAL INDICATION: Screening
TECHNIQUE: Digital bilateral mammograms and 3-D Tomosynthesis were obtained. 
These were interpreted both primarily and with the aid of computer-aided 
detection system.

[L CC synth-2D]
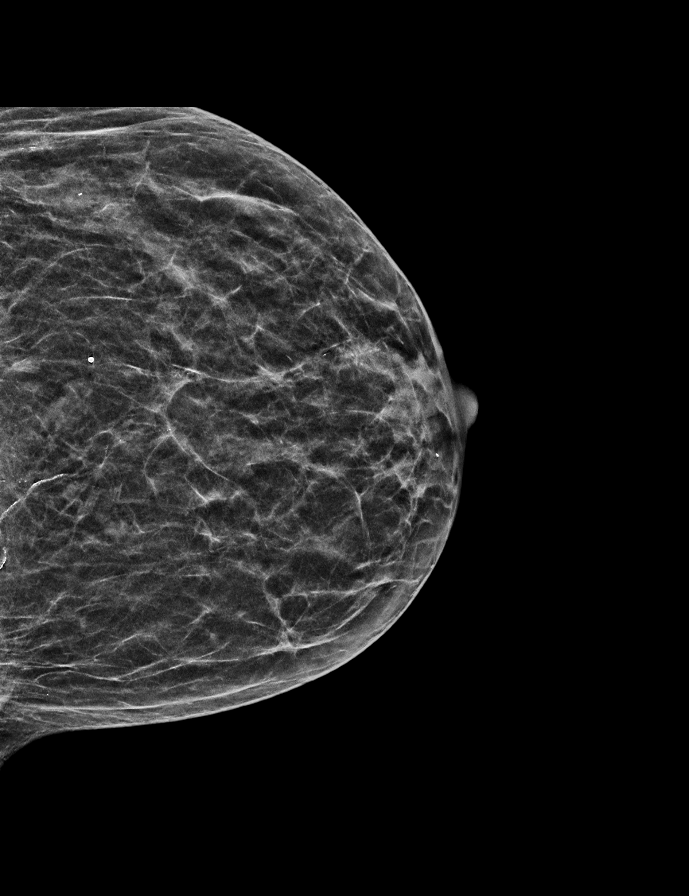

[L MLO synth-2D]
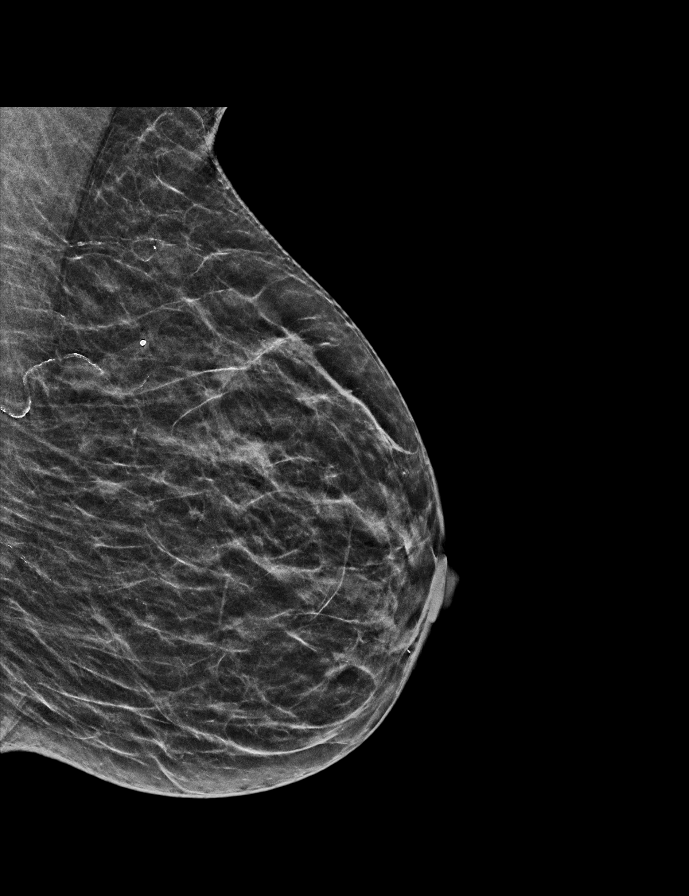

[R CC synth-2D]
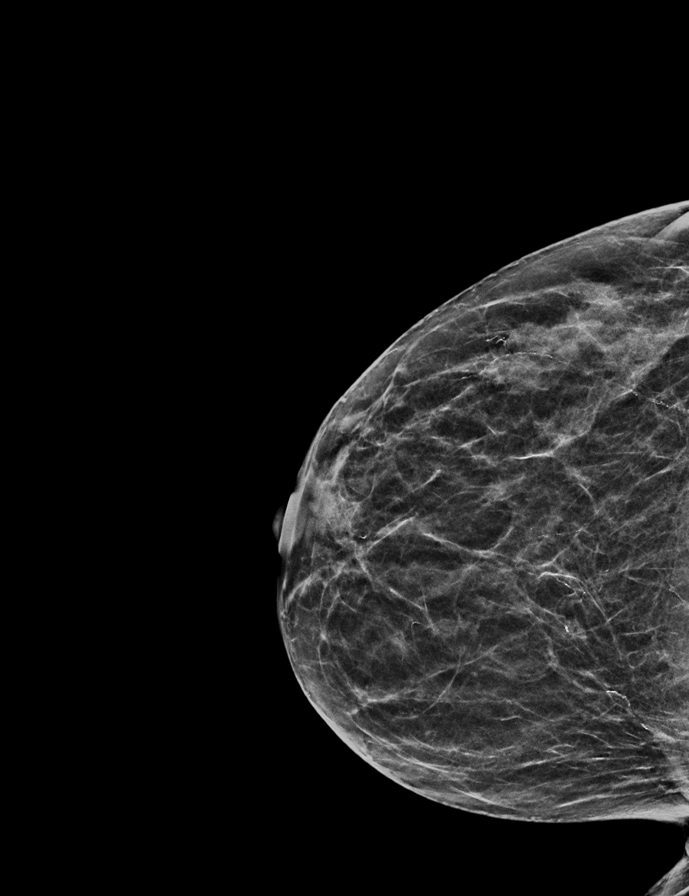

[R MLO synth-2D]
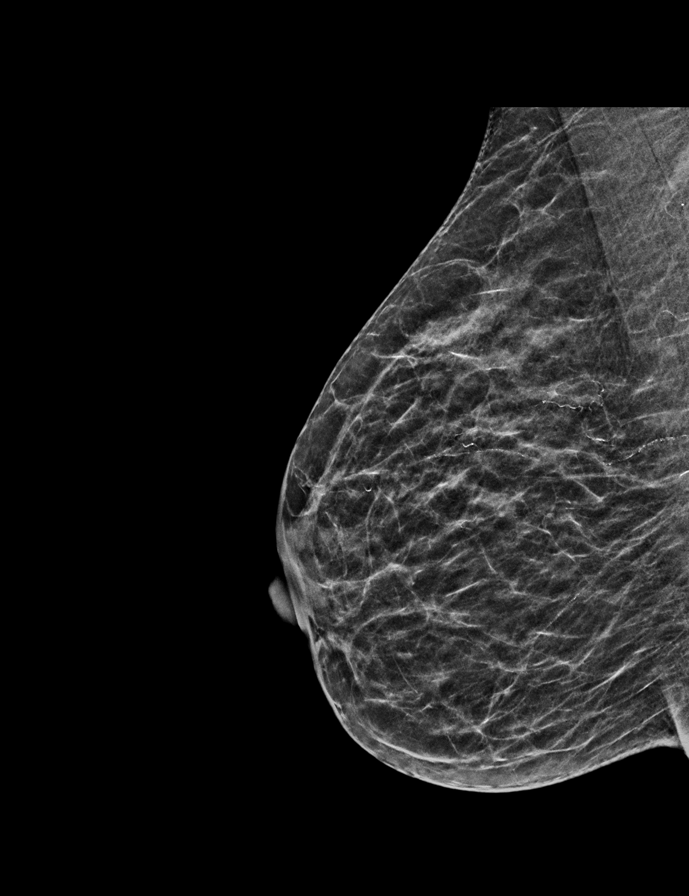

[R CC tomo (1 of 2)]
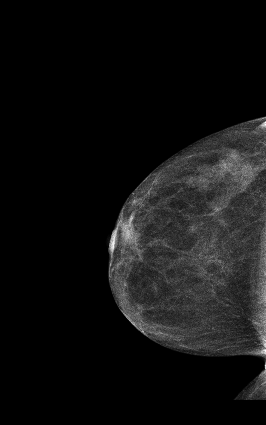

[R MLO tomo (1 of 2)]
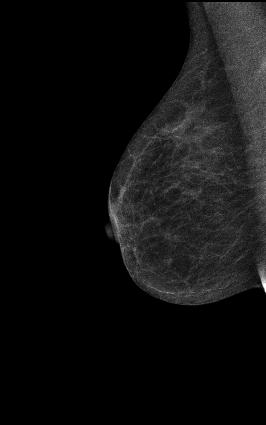

[L CC tomo (1 of 2)]
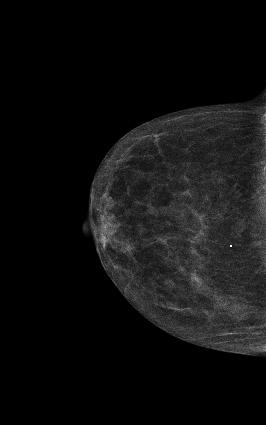

[L MLO tomo (1 of 2)]
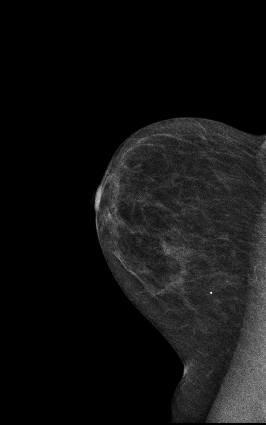

[L MLO tomo (2 of 2) · tomo slice 21/41.0]
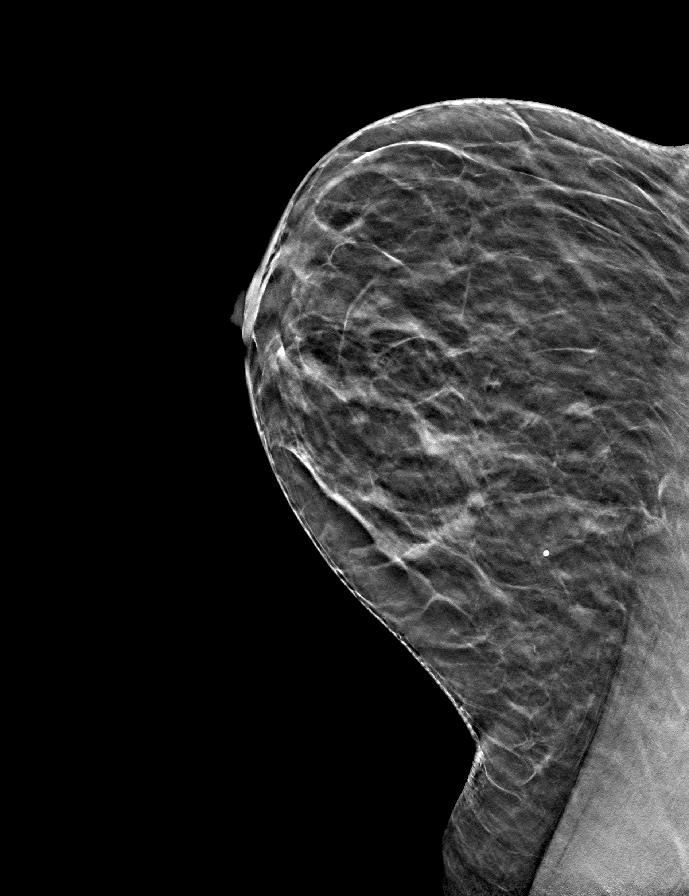

[L CC tomo (2 of 2) · tomo slice 21/40.0]
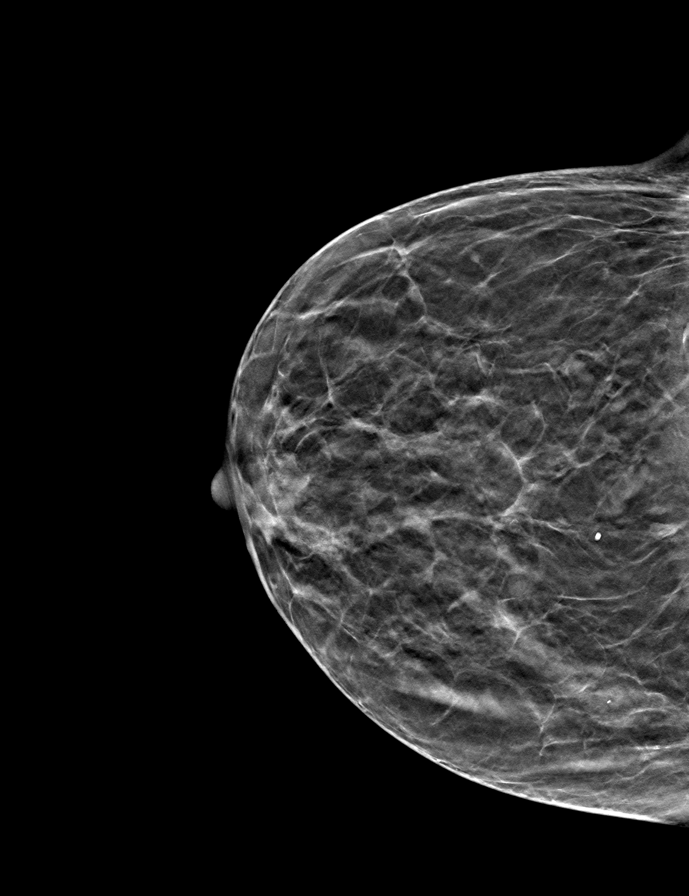

[R MLO tomo (2 of 2) · tomo slice 19/38.0]
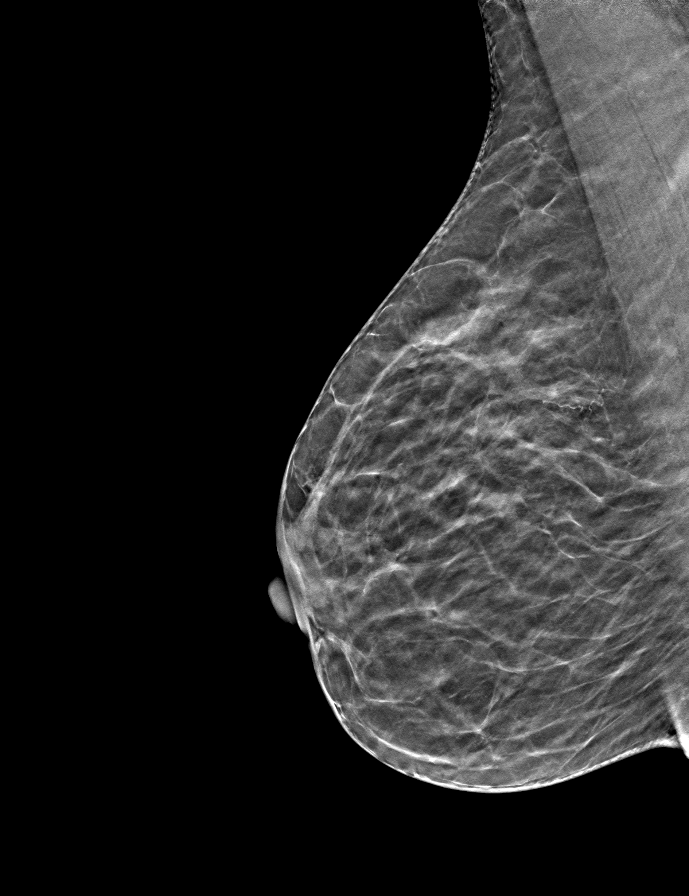

[R CC tomo (2 of 2) · tomo slice 21/41.0]
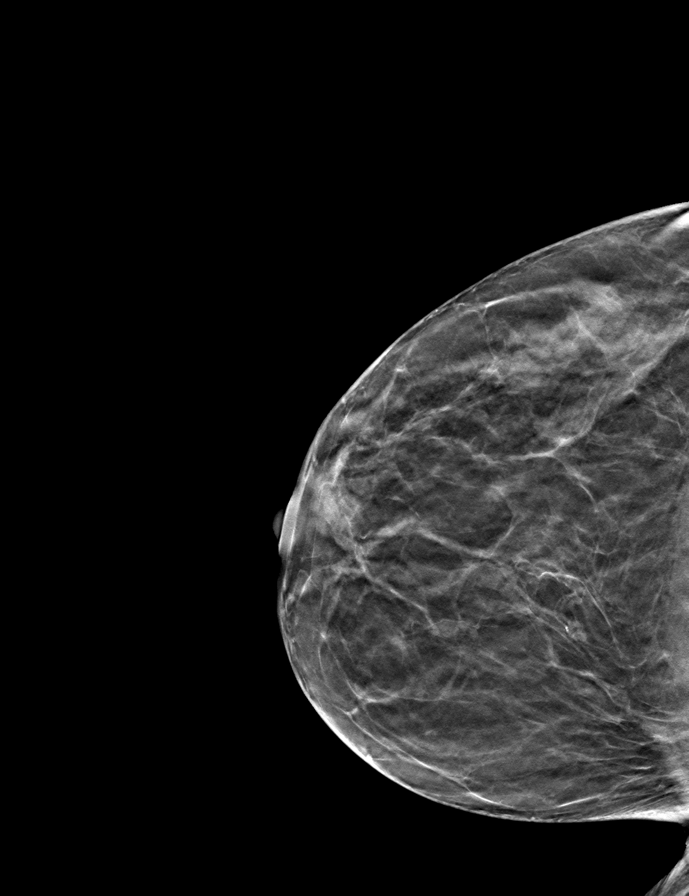

[12 of 28 positions shown; findings below may reference images not displayed]

FINDINGS: There is no evidence of mass or malignant appearing 
microcalcifications 
seen on today's examination. No skin thickening is identified. There is 
thought 
to be an overall stable mammographic appearance.
IMPRESSION: ( BI-RADS 2) Benign findings. Routine mammographic follow-up is recommended.

## 2017-11-14 IMAGING — MG MAMMOGRAPHY SCREENING BILATERAL 3D TOMOSYNTHESIS WITH CAD
8 series · 9 of 24 positions shown · non-contrast
Comparison: 06/17/2016 through 01/25/2014 
BREAST DENSITY: (Level B) There are scattered areas of fibroglandular density.

MAMMOGRAPHY SCREENING BILATERAL 3D TOMOSYNTHESIS WITH CAD, 11/14/2017 [DATE]: 
CLINICAL INDICATION: Screening.
TECHNIQUE: Digital bilateral mammograms and 3-D Tomosynthesis were obtained. 
These were interpreted both primarily and with the aid of computer-aided 
detection system.

[R CC]
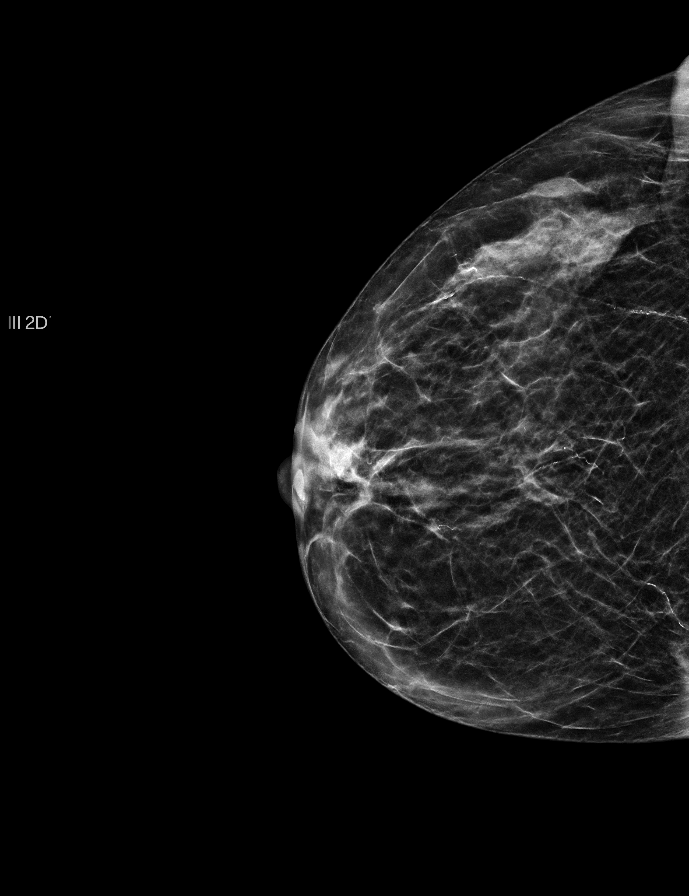

[L MLO]
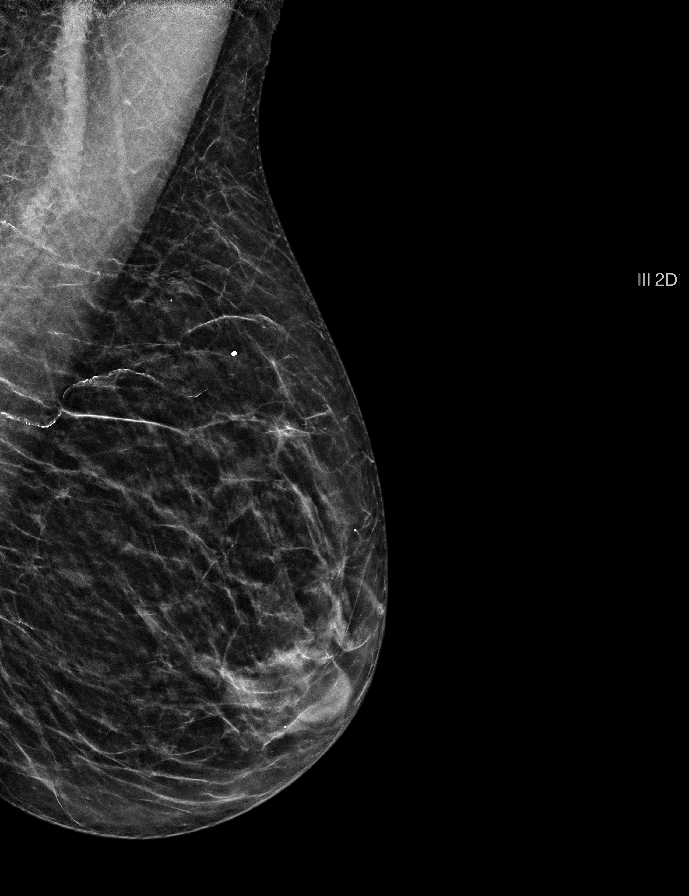

[R MLO]
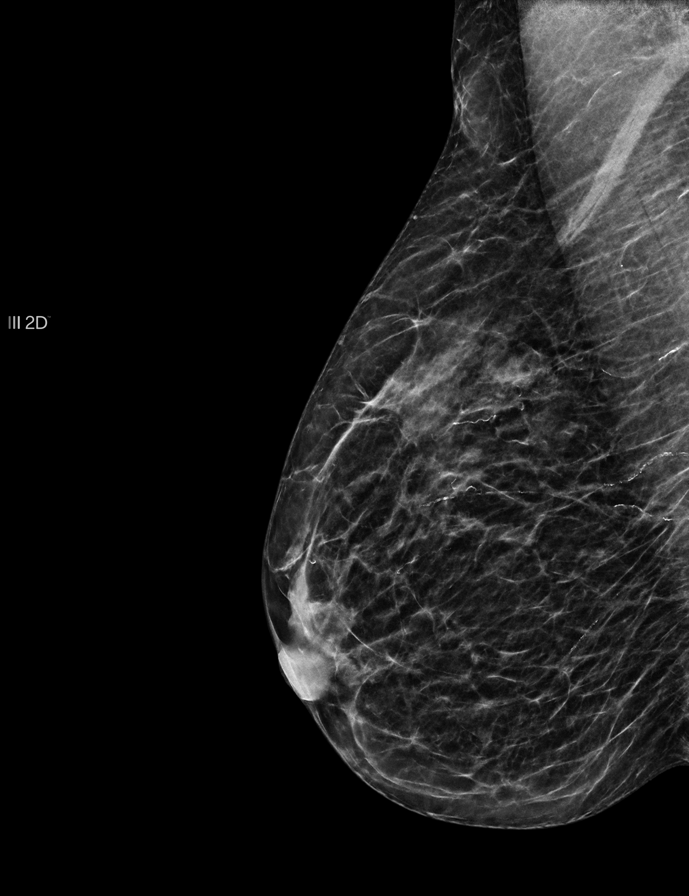

[L CC]
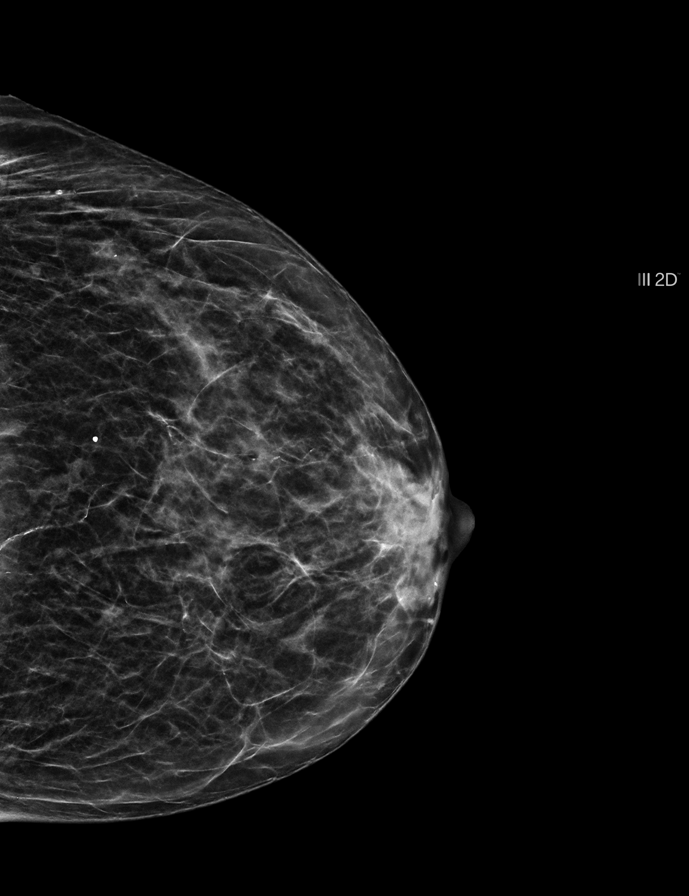

[L CC tomo · 2 of 38 frames shown]
[frame 13/38]
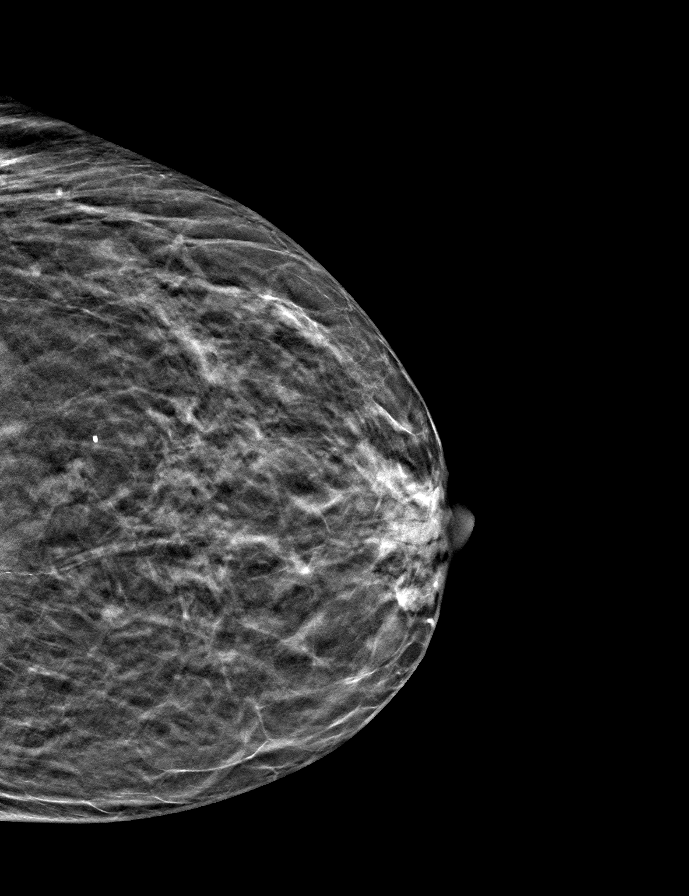
[frame 19/38]
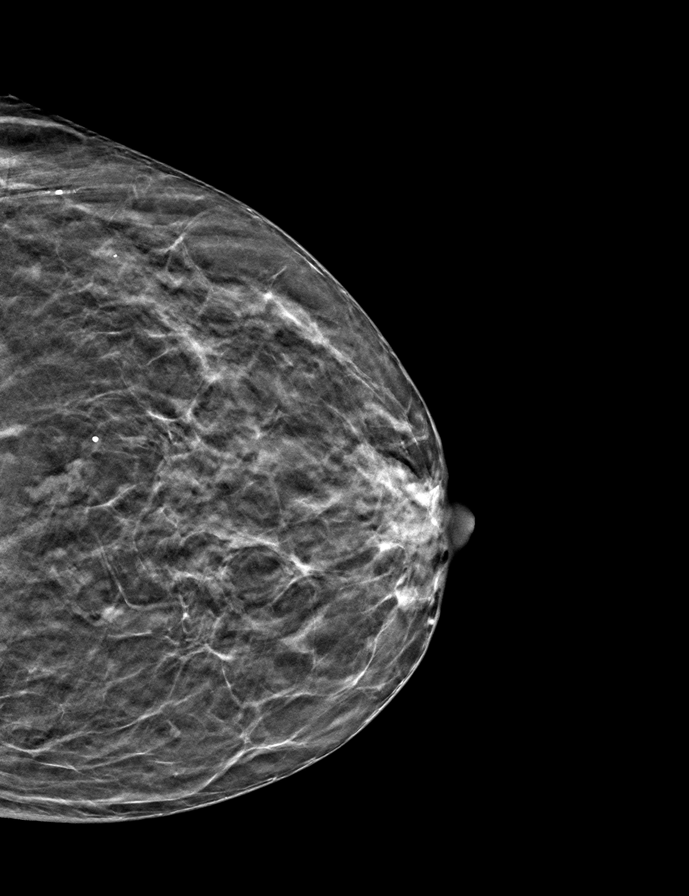

[R MLO tomo · tomo slice 19/38.0]
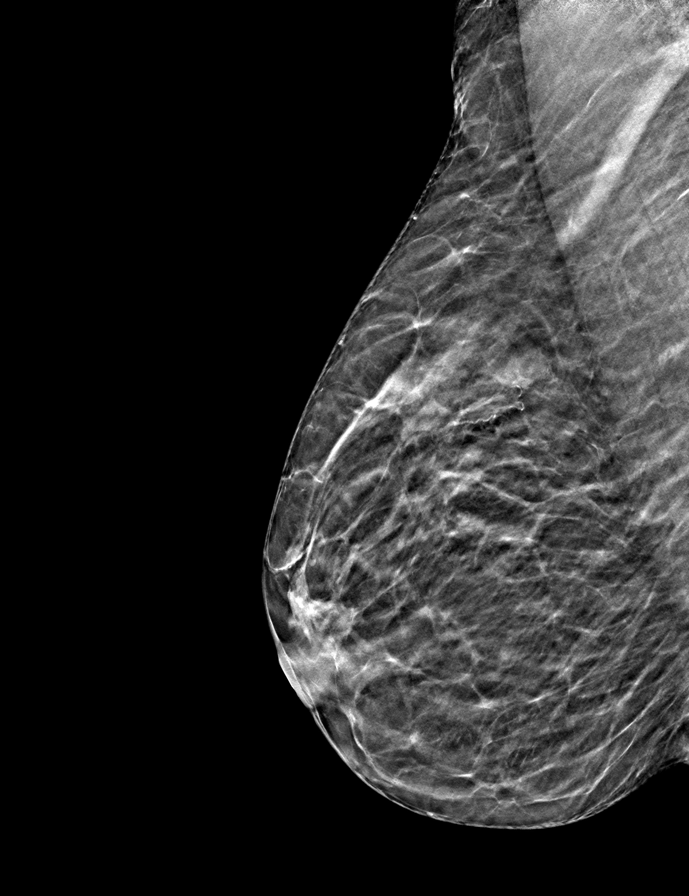

[L MLO tomo · tomo slice 21/42.0]
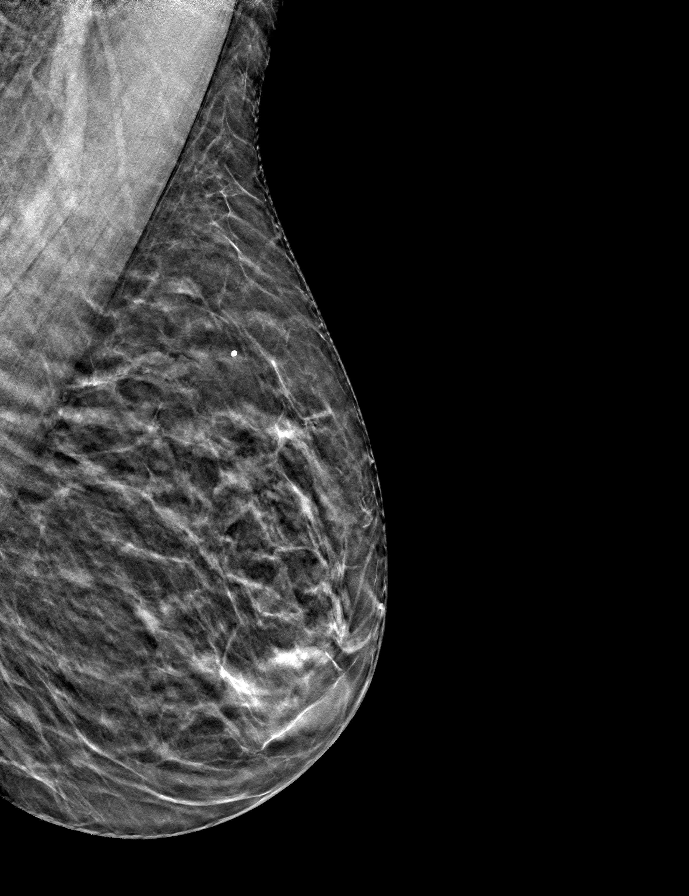

[R CC tomo · tomo slice 19/37.0]
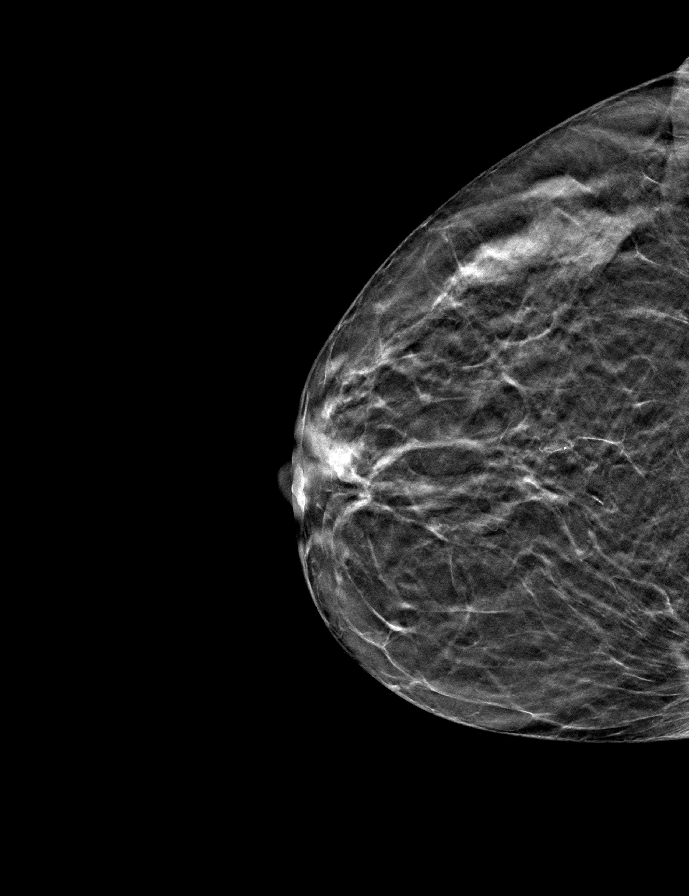

[9 of 24 positions shown; findings below may reference images not displayed]

FINDINGS: No mammographically suspicious abnormality and no significant change.
IMPRESSION: ( BI-RADS 2) Benign findings. Routine mammographic follow-up is recommended.

## 2018-10-19 ENCOUNTER — Inpatient Hospital Stay
Admit: 2018-10-19 | Discharge: 2018-10-20 | Disposition: A | Discharge status: Home / Self Care | Payer: MEDICARE | Attending: Emergency Medicine

## 2018-10-19 DIAGNOSIS — S81812A Laceration without foreign body, left lower leg, initial encounter: Secondary | ICD-10-CM

## 2018-10-19 NOTE — Discharge Instructions (Signed)
Please return to the emergency department, and urgent care or your family physician for suture removal in 7-10 days.  Return immediately to emergency department for worsening pain, redness, discharge, you develop a fever, have worsening of the condition or any other concerns.

## 2018-10-19 NOTE — ED Triage Notes (Signed)
Pt states she was getting out of the car and caught her leg and pt is on plavix so she has thin skin

## 2018-10-19 NOTE — ED Notes (Signed)
Patient prepared for and ready to be discharged. Patient discharged at this time in no acute distress after verbalizing understanding of discharge instructions. Patient left after receiving After Visit Summary instructions.        Smitty Knudsen, RN  10/19/18 2101

## 2018-10-19 NOTE — ED Notes (Signed)
Dressing applied to leg     Smitty Knudsen, RN  10/19/18 2101

## 2018-10-19 NOTE — ED Provider Notes (Signed)
eMERGENCY dEPARTMENT eNCOUnter      PtName: Destiny Salinas  MRN: [id number]  [place] Oct 15, 1932  Date of evaluation: 10/19/2018  Provider: Mykala Mccready Joice Lofts, [place]       Chief Complaint   Patient presents with   ??? Laceration     left leg         HISTORY OF PRESENT ILLNESS   (Location/Symptom, Timing/Onset,Context/Setting, Quality, Duration, Modifying Factors, Severity) Note limiting factors.   HPI    Destiny Salinas is a 85 y.o. female who presents to the emergency department with chief complaint of leg laceration to the left lower leg.  She excellently shot in a car door prior to arrival.  Pain described as sharp, constant, 4/10 without radiation.  No alleviating aggravating factors.  No associated numbness or weakness distally.  Tetanus is 85 years old.    Nursing Notes were reviewed.    REVIEW OF SYSTEMS    (2+ forlevel 4; 10+ for level 5)     Review of Systems  See hpi for further details. Complete 10 point review of all systems performed and is otherwise negative unless noted above.    PAST MEDICAL HISTORY     Past Medical History:   Diagnosis Date   ??? CAD (coronary artery disease) 08/06/2012   ??? Hypertension    ??? Psychiatric problem     depression   ??? Thyroid disease     hypothyroidism   ??? Urinary frequency        SURGICAL HISTORY       Past Surgical History:   Procedure Laterality Date   ??? APPENDECTOMY     ??? COLONOSCOPY  2006   ??? CORONARY ANGIOPLASTY WITH STENT PLACEMENT  08/06/2012    DES to mid LAD, XIENCE 3.5x23   ??? EYE SURGERY      bil. cataracts removed w/ lens implants   ??? FRACTURE SURGERY      Rt ankle with ORIF w/ 3 pins   ??? HYSTERECTOMY     ??? KNEE ARTHROSCOPY      right knee torn meniscus   ??? TONSILLECTOMY         CURRENT MEDICATIONS       Previous Medications    AMLODIPINE-ATORVASTATATIN (CADUET) 10-10 MG PER TABLET    Take 1 tablet by mouth daily    ASPIRIN 81 MG CHEWABLE TABLET    Take 81 mg by mouth daily.    ATORVASTATIN (LIPITOR) 20 MG TABLET    Take 1 tablet by mouth nightly.     CLOPIDOGREL (PLAVIX) 75 MG TABLET    Take 75 mg by mouth daily    LEVOTHYROXINE (LEVOTHROID) 75 MCG TABLET    Take 1 tablet by mouth daily.    LISINOPRIL (PRINIVIL;ZESTRIL) 40 MG TABLET    Take 1 tablet by mouth daily.    METOPROLOL (TOPROL XL) 25 MG XL TABLET    Take 25 mg by mouth 2 times daily.    NITROGLYCERIN (NITROSTAT) 0.4 MG SL TABLET    Place 1 tablet under the tongue every 5 minutes as needed for Chest pain.    SOLIFENACIN (VESICARE) 10 MG TABLET    Take 5 mg by mouth daily    TRAZODONE (DESYREL) 50 MG TABLET    Take 50 mg by mouth nightly.       ALLERGIES     Tape Dorma Russell tape]    FAMILY HISTORY     History reviewed. No pertinent family history.  SOCIAL HISTORY       Social History     Socioeconomic History   ??? Marital status: Widowed     Spouse name: None   ??? Number of children: None   ??? Years of education: None   ??? Highest education level: None   Occupational History   ??? None   Social Needs   ??? Financial resource strain: None   ??? Food insecurity     Worry: None     Inability: None   ??? Transportation needs     Medical: None     Non-medical: None   Tobacco Use   ??? Smoking status: Never Smoker   ??? Smokeless tobacco: Never Used   Substance and Sexual Activity   ??? Alcohol use: Yes     Alcohol/week: 14.0 standard drinks     Types: 14 Glasses of wine per week   ??? Drug use: No   ??? Sexual activity: Yes     Partners: Male   Lifestyle   ??? Physical activity     Days per week: None     Minutes per session: None   ??? Stress: None   Relationships   ??? Social Wellsite geologist on phone: None     Gets together: None     Attends religious service: None     Active member of club or organization: None     Attends meetings of clubs or organizations: None     Relationship status: None   ??? Intimate partner violence     Fear of current or ex partner: None     Emotionally abused: None     Physically abused: None     Forced sexual activity: None   Other Topics Concern   ??? None   Social History Narrative   ??? None        SCREENINGS           PHYSICAL EXAM    (5+ for level 4, 8+ for level 5)     ED Triage Vitals [10/19/18 1912]   BP Temp Temp Source Pulse Resp SpO2 Height Weight   (!) 179/72 98 ??F (36.7 ??C) Oral 73 18 97 % -- 140 lb (63.5 kg)       Physical Exam  Constitutional:       General: She is not in acute distress.  Skin:     Comments: Patient with a 7 cm semicircular laceration involving the lateral aspect of the left lower leg.  Neurovascular intact distally.  No obvious foreign body.  No obvious tendon deficit distally.  There is associated bruising with a skin flap.         DIAGNOSTIC RESULTS       RADIOLOGY (Per Emergency Physician):     Interpretation per the Radiologist below, if available at the time of this note:  No results found.    LABS:  Labs Reviewed - No data to display    All other labs were within normal range or not returned as of this dictation.    EMERGENCY DEPARTMENT COURSE and DIFFERENTIAL DIAGNOSIS/MDM:   Vitals:    Vitals:    10/19/18 1912   BP: (!) 179/72   Pulse: 73   Resp: 18   Temp: 98 ??F (36.7 ??C)   TempSrc: Oral   SpO2: 97%   Weight: 140 lb (63.5 kg)       Medications   Tetanus-Diphth-Acell Pertussis (BOOSTRIX) injection 0.5 mL (0.5 mLs Intramuscular Given  10/19/18 2034)   cephALEXin (KEFLEX) capsule 500 mg (500 mg Oral Given 10/19/18 2034)       MDM.  Laceration repaired with suture.  Patient does have thin skin as well as bruising.  She is on aspirin and Plavix.  She has an increased risk of dehiscence due to the thinness of the skin as well as the bruising of the skin.  Recommend antibiotic prophylaxis as well as tetanus to be updated.  Patient given strict return precautions and instructed to have the sutures removed in 7 to 10 days.    Patient instructed to follow up with their primary care doctor in one-two days and return to the emergency department if worsening of the condition or any other concerns. Please see discharge instructions for further delineation regarding the specific  discharge instructions explained and given to the patient.    CONSULTS:  None    PROCEDURES:  Lac Repair    Date/Time: 10/19/2018 8:57 PM  Performed by: Marquita PalmsJeffrey S Shadoe Bethel, DO  Authorized by: Marquita PalmsJeffrey S Chrisoula Zegarra, DO     Consent:     Consent obtained:  Verbal    Consent given by:  Patient    Risks discussed:  Infection, pain, need for additional repair, poor cosmetic result, nerve damage, poor wound healing, tendon damage, vascular damage and retained foreign body (As well as dehiscence)    Alternatives discussed:  Delayed treatment, no treatment, referral and observation  Anesthesia (see MAR for exact dosages):     Anesthesia method:  Local infiltration    Local anesthetic:  Lidocaine 1% WITH epi  Laceration details:     Location:  Leg    Leg location:  L lower leg    Length (cm):  7  Repair type:     Repair type:  Simple  Pre-procedure details:     Preparation:  Patient was prepped and draped in usual sterile fashion  Exploration:     Hemostasis achieved with:  Direct pressure and epinephrine    Wound exploration: wound explored through full range of motion and entire depth of wound probed and visualized      Wound extent: no areolar tissue violation noted, no fascia violation noted, no foreign bodies/material noted, no muscle damage noted, no nerve damage noted, no tendon damage noted, no underlying fracture noted and no vascular damage noted    Treatment:     Area cleansed with:  Hibiclens    Amount of cleaning:  Standard    Irrigation solution:  Sterile saline    Irrigation method:  Syringe    Visualized foreign bodies/material removed: no    Skin repair:     Repair method:  Sutures    Suture size:  4-0    Suture material:  Nylon    Suture technique:  Simple interrupted    Number of sutures:  11  Approximation:     Approximation:  Close  Post-procedure details:     Dressing:  Sterile dressing    Patient tolerance of procedure:  Tolerated well, no immediate complications        FINAL IMPRESSION      1. Laceration of  left lower extremity, initial encounter    2. Need for Tdap vaccination          DISPOSITION/PLAN   DISPOSITION Decision To Discharge 10/19/2018 08:22:50 PM      PATIENT REFERRED TO:  Atilano MedianFabiola Feldhaus, MD  [address]  [place] [id number]  [phone]    Schedule  an appointment as soon as possible for a visit       [facility] Emergency Department  [address]  [place] [address]  [phone]    If symptoms worsen      DISCHARGE MEDICATIONS:  New Prescriptions    CEPHALEXIN (KEFLEX) 500 MG CAPSULE    Take 1 capsule by mouth 3 times daily for 7 days          (Please note:  Portions of this note were completed with a voice recognition program. Efforts were made to edit the dictations but occasionally words and phrases are mis-transcribed.)    Form [id number]    Landynn Dupler Drucilla Schmidt, DO (electronically signed)  Emergency Medicine Provider             Marquita Palms, DO  10/19/18 2058

## 2018-10-20 MED ORDER — CEPHALEXIN 500 MG PO CAPS
500 MG | Freq: Once | ORAL | Status: AC
Start: 2018-10-20 — End: 2018-10-19
  Administered 2018-10-20: 01:00:00 500 mg via ORAL

## 2018-10-20 MED ORDER — CEPHALEXIN 500 MG PO CAPS
500 MG | ORAL_CAPSULE | Freq: Three times a day (TID) | ORAL | 0 refills | Status: AC
Start: 2018-10-20 — End: 2018-10-26

## 2018-10-20 MED ORDER — TETANUS-DIPHTH-ACELL PERTUSSIS 5-2.5-18.5 LF-MCG/0.5 IM SUSP
5-2.5-18.5-0.5 LF-MCG/0.5 | Freq: Once | INTRAMUSCULAR | Status: AC
Start: 2018-10-20 — End: 2018-10-19
  Administered 2018-10-20: 01:00:00 0.5 mL via INTRAMUSCULAR

## 2018-10-20 MED FILL — BOOSTRIX 5-2.5-18.5 LF-MCG/0.5 IM SUSP: INTRAMUSCULAR | Qty: 0.5

## 2018-10-20 MED FILL — CEPHALEXIN 500 MG PO CAPS: 500 MG | ORAL | Qty: 1

## 2019-01-16 IMAGING — CT CT BRAIN WITHOUT CONTRAST
3 series · 14 of 47 positions shown, 16 images · non-contrast
Comparison: There are no previous exams available for comparison.

CT BRAIN WITHOUT CONTRAST, 01/16/2019 [DATE]: 
CLINICAL INDICATION:  Injured head. Fall backwards while stretching striking 
tile floor. No loss of consciousness. Injury occurred one day ago. Slightly 
unsteady gait. Fatigue. 
A search for DICOM formatted images was conducted for prior CT imaging studies 
completed at a non-affiliated media free facility.
TECHNIQUE: The head was scanned from vertex through skull base without contrast 
on a high resolution CT scanner using dose reduction techniques. Routine MPR 
reconstructions were performed.

[Series 2: head w/o 3.0 j30s 1 · axial · non-contrast · 0.40mm/px · z∈[-100,+32]mm · 8 of 52 slices shown, 10 images]
[im 4/52  brain]
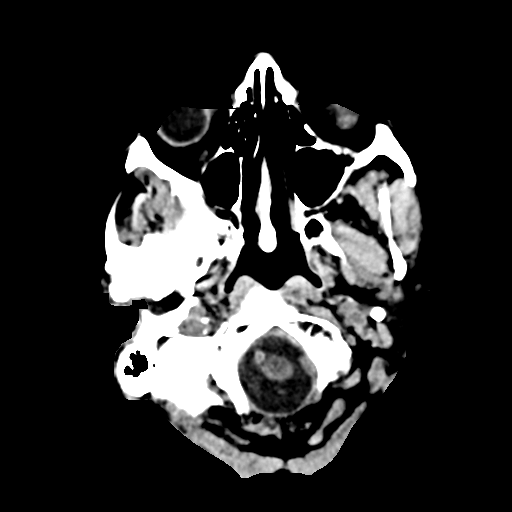
[im 4/52  bone]
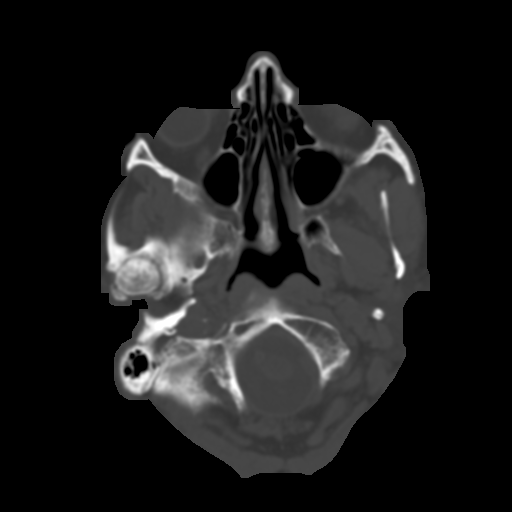
[im 11/52  brain]
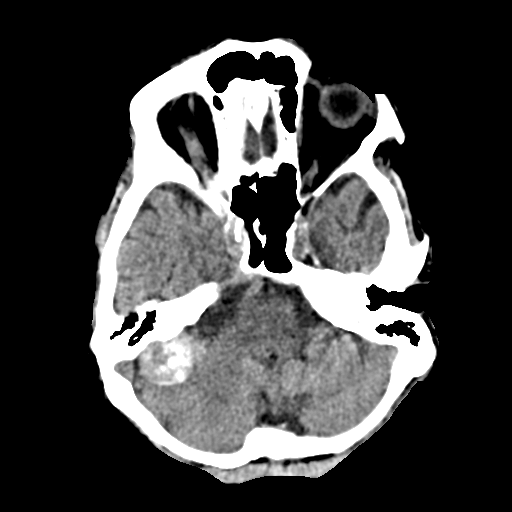
[im 16/52  brain]
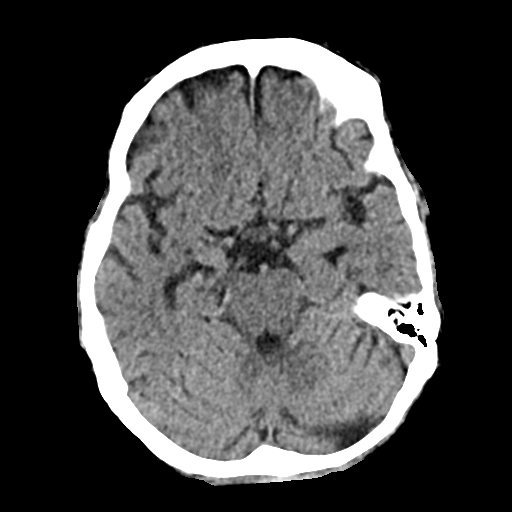
[im 23/52  brain]
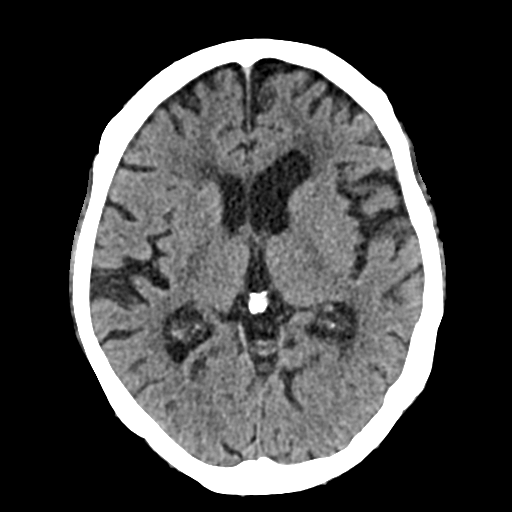
[im 29/52  brain]
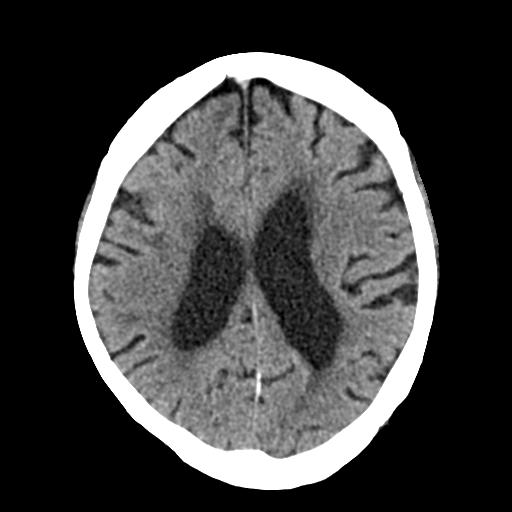
[im 29/52  bone]
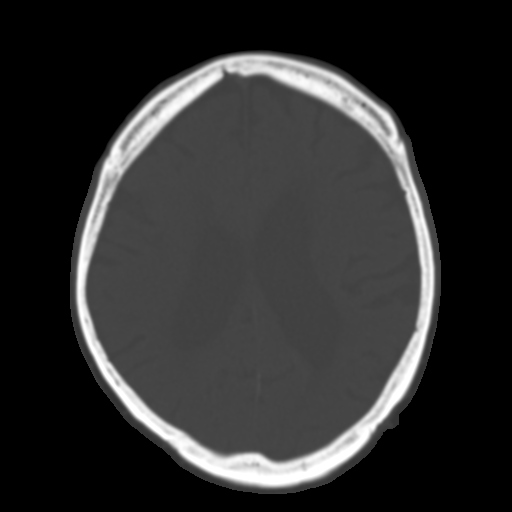
[im 36/52  brain]
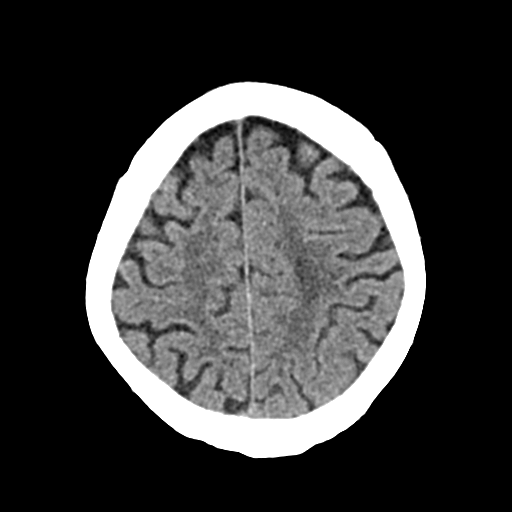
[im 41/52  brain]
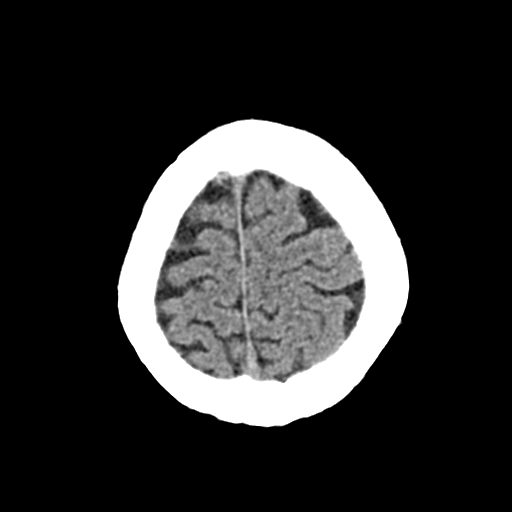
[im 48/52  brain]
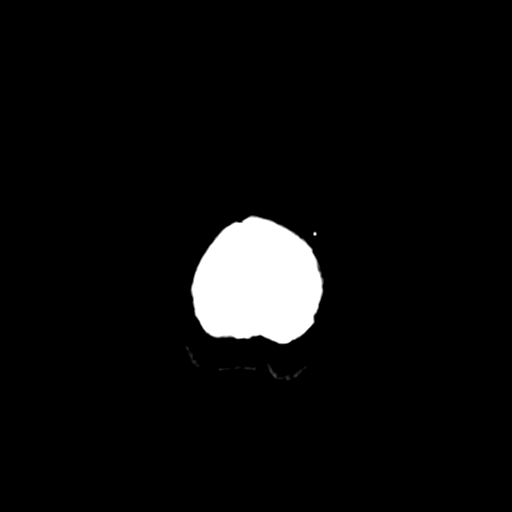

[Series 4: coronal · coronal · 0.30mm/px · 3 of 92 slices shown]
[im 31/92  brain]
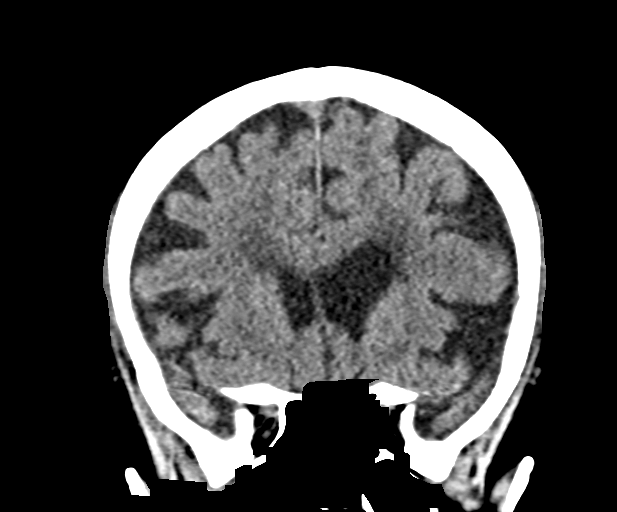
[im 41/92  brain]
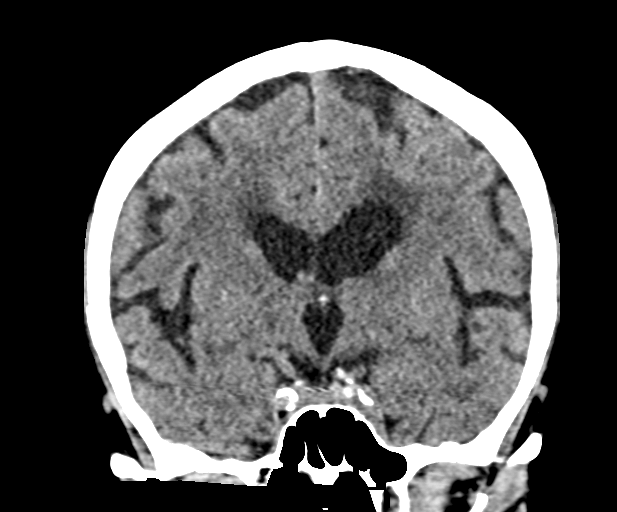
[im 51/92  brain]
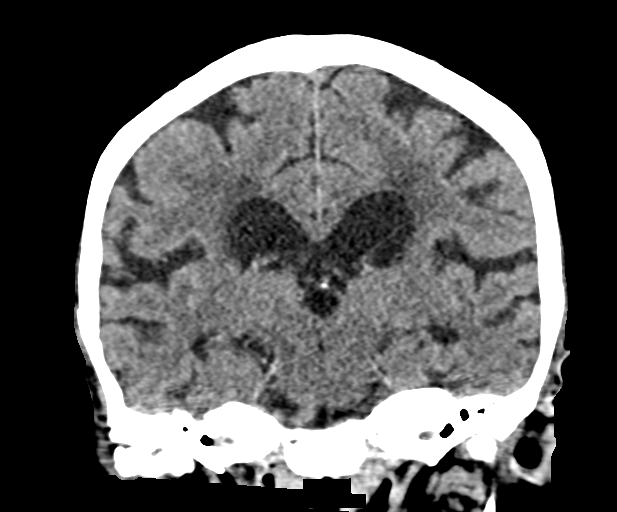

[Series 5: sag · sagittal · 0.30mm/px · 3 of 53 slices shown]
[im 18/53  brain]
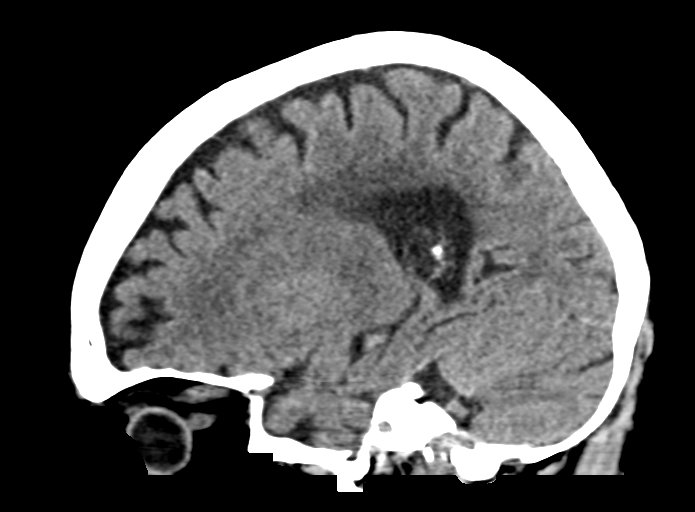
[im 27/53  brain]
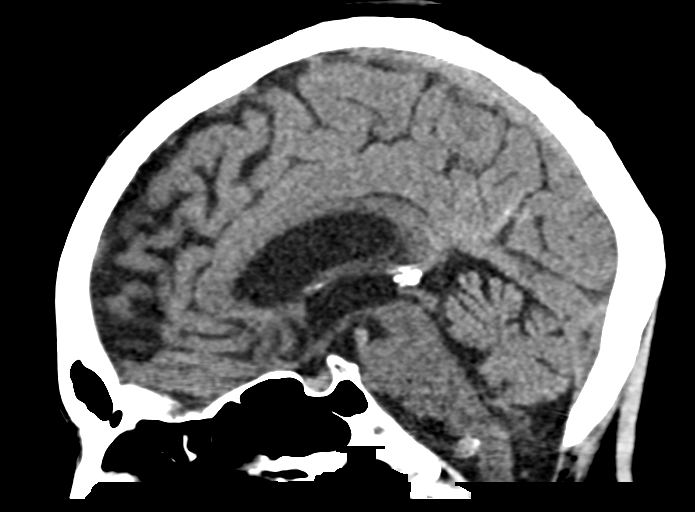
[im 35/53  brain]
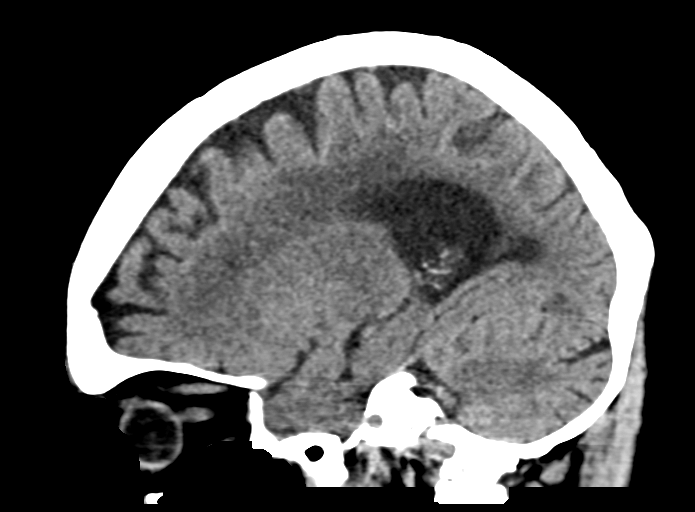

[14 of 47 positions shown; findings below may reference images not displayed]

FINDINGS: There are patchy areas of decreased attenuation within the 
periventricular and deep cortical white matter most compatible with chronic 
microangiopathic disease. Mild global parenchymal atrophy. Ventricular size is 
commensurate with degree of parenchymal atrophy. No acute intracranial 
hemorrhage. No acute or chronic infarct. There is an extra-axial partially 
calcified mass within the posterior fossa abutting the lateral aspect of the 
right cerebellar hemisphere. This measures approximately 2.4 x 1.8 x 2.2 cm with 
appearance most suggestive for a meningioma. No discrete surrounding vasogenic 
edema.
IMPRESSION: 1.  No acute intracranial hemorrhage or acute intracranial findings. 
2.  Mild global parenchymal atrophy and chronic microangiopathic changes. 
3.  Right posterior fossa extra-axial partially calcified mass suggestive for 
meningioma. Recommend MRI examination of the brain without and with contrast 
material. 
RADIATION DOSE REDUCTION: All CT scans are performed using radiation dose 
reduction techniques, when applicable.  Technical factors are evaluated and 
adjusted to ensure appropriate moderation of exposure.  Automated dose 
management technology is applied to adjust the radiation doses to minimize 
exposure while achieving diagnostic quality images.

## 2019-02-02 IMAGING — MR MRI BRAIN W/WO CONTRAST
4 of 11 series · 17 of 48 positions shown · IV contrast (gadavist)
Comparison: Cranial CT January 16, 2019

MRI BRAIN W/WO CONTRAST,02/02/2019 [DATE]: 
CLINICAL INDICATION: Status post fall, dizziness and gait unsteadiness, 
intracranial mass
TECHNIQUE: Axial T1, Axial T2, Axial FLAIR, Diffusion weighted images, Sagittal 
T1, Enhanced Axial T1, and Enhanced coronal fat-suppressed T1 were obtained.
cc's of gadavist was injected intravenously by hand.

[Series 401: FLAIR · axial · 5.0mm · 0.65mm/px · z∈[-74,+79]mm · 3 of 27 slices shown]
[im 1/27]
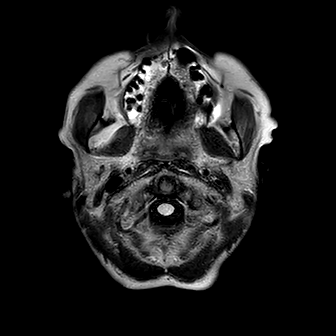
[im 14/27]
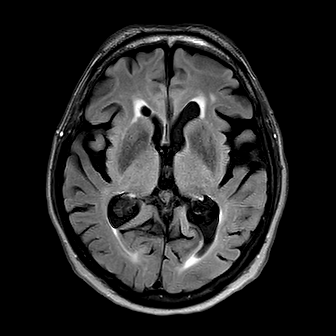
[im 27/27]
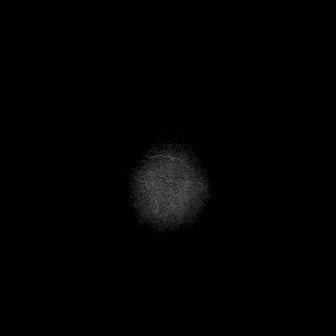

[Series 701: T2 · axial · 5.0mm · 0.43mm/px · z∈[-74,+79]mm · 4 of 27 slices shown]
[im 1/27]
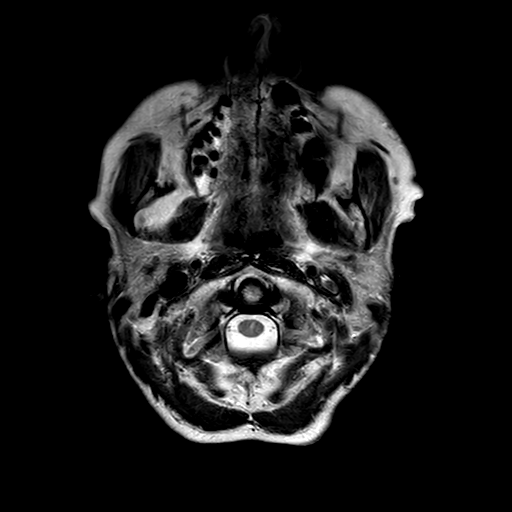
[im 9/27]
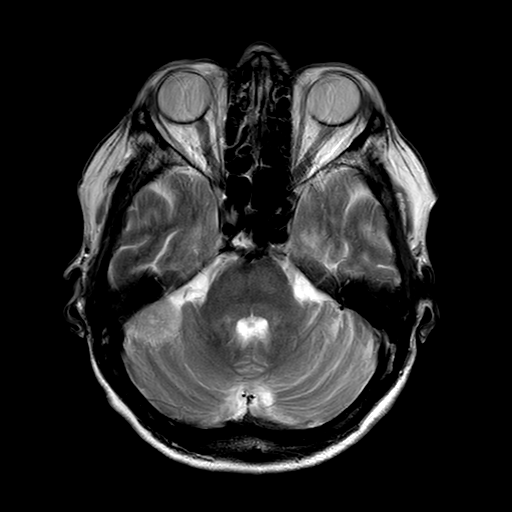
[im 18/27]
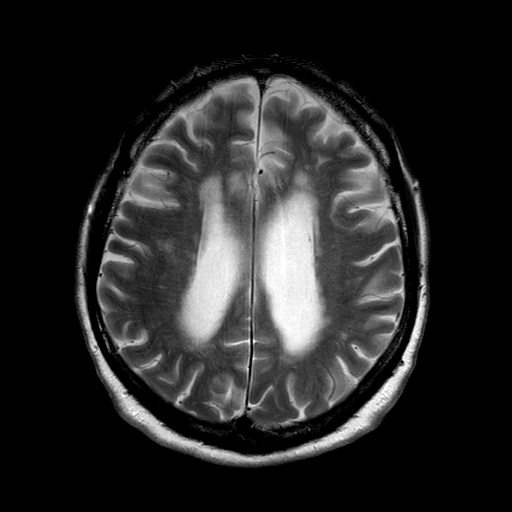
[im 27/27]
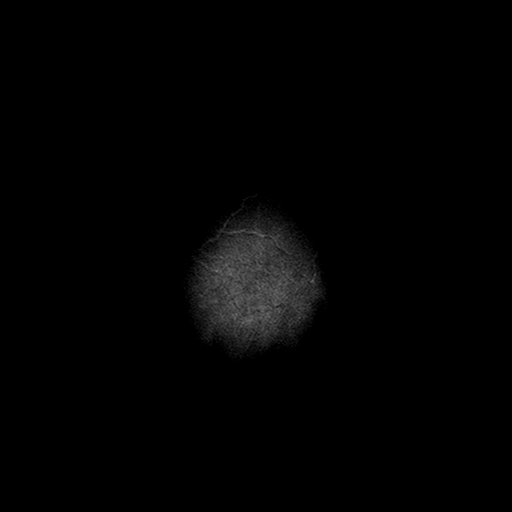

[Series 901: T1 post-contrast · coronal · 4.0mm · 0.38mm/px · 5 of 36 slices shown (1 of 2)]
[im 1/36]
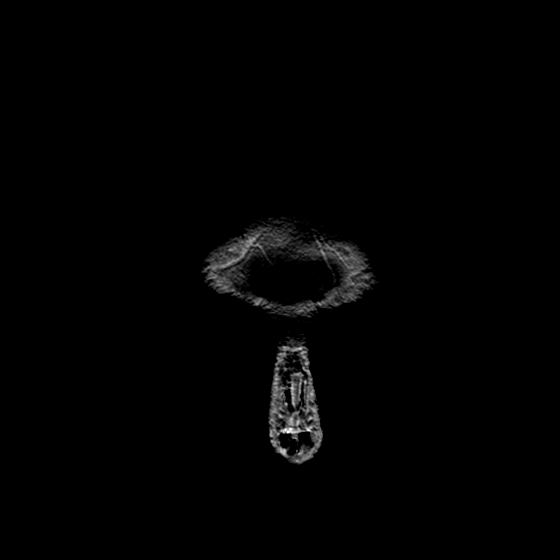
[im 9/36]
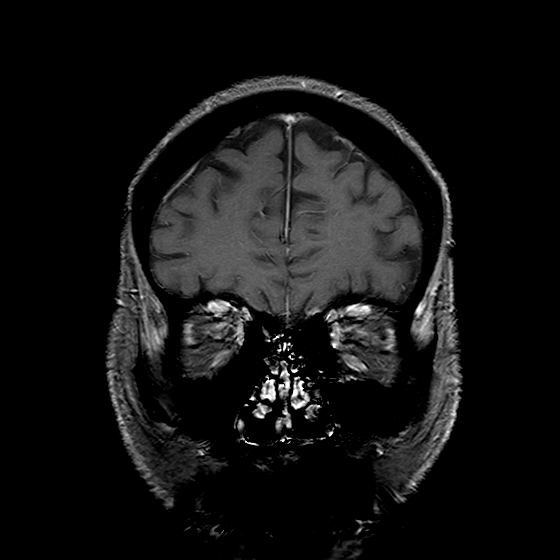
[im 18/36]
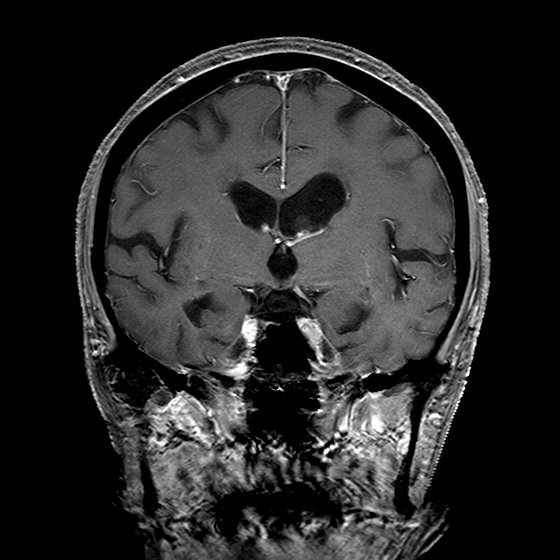
[im 27/36]
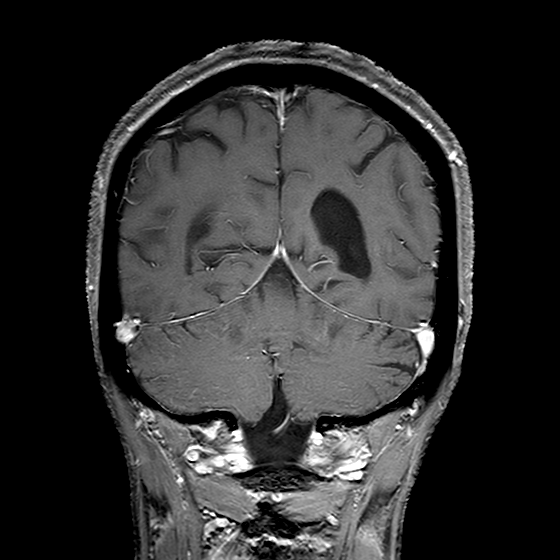
[im 36/36]
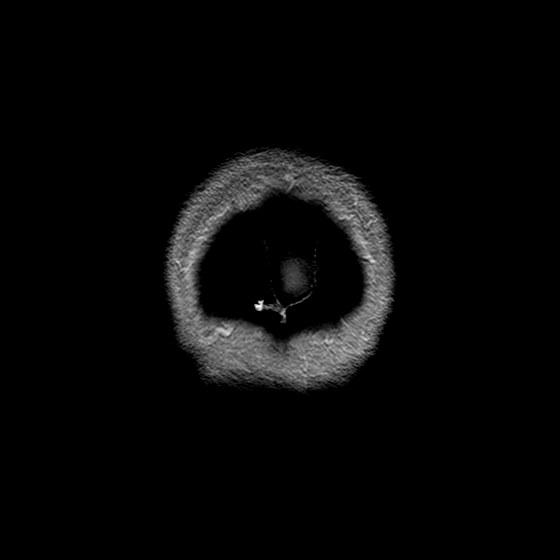

[Series 1001: T1 post-contrast · sagittal · 4.0mm · 0.34mm/px · 5 of 36 slices shown (2 of 2)]
[im 1/36]
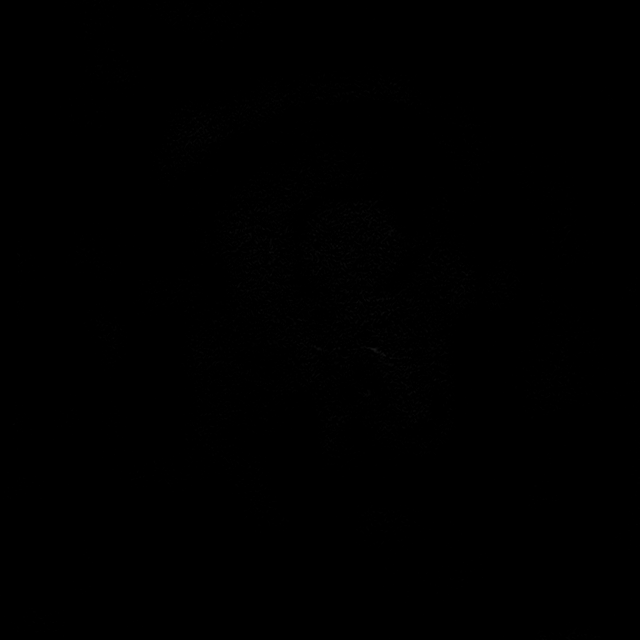
[im 9/36]
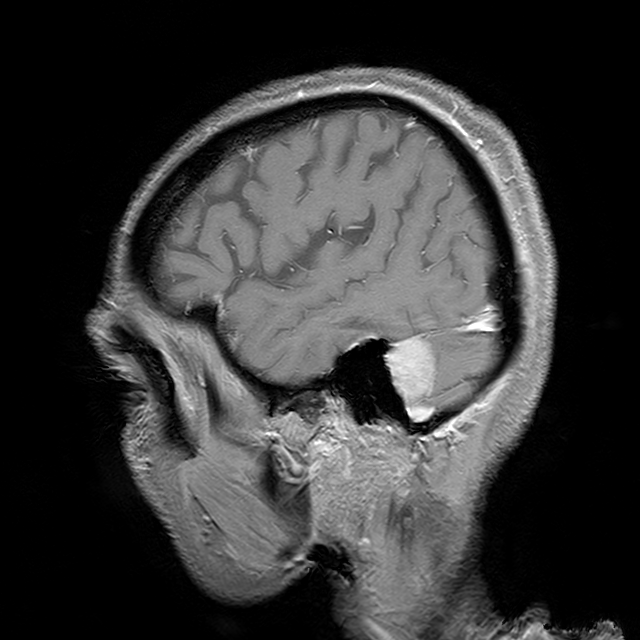
[im 18/36]
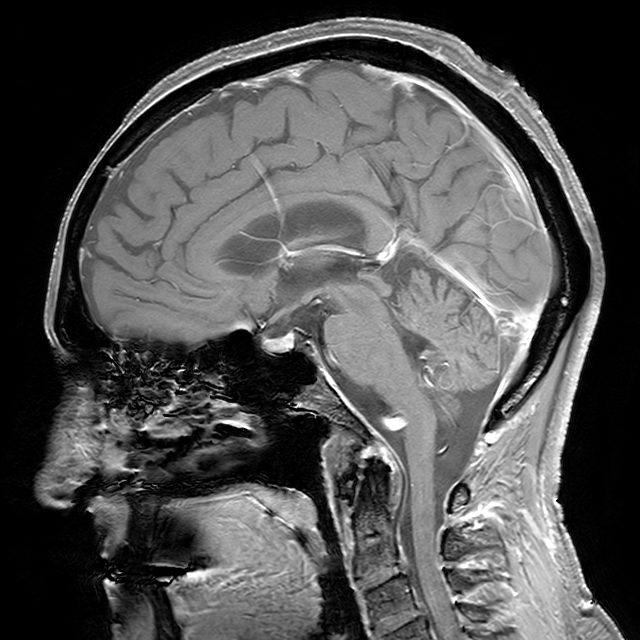
[im 27/36]
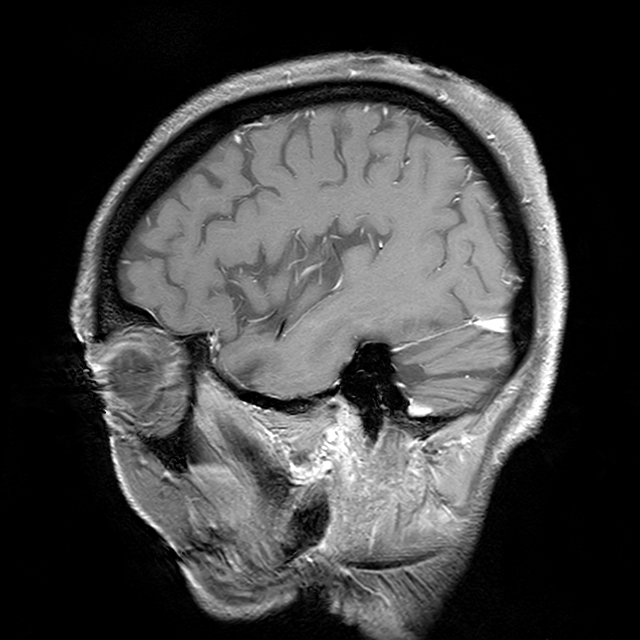
[im 36/36]
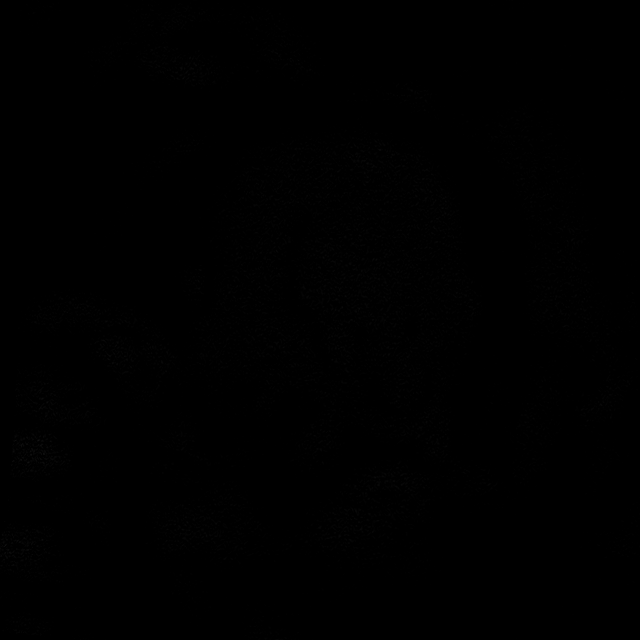

[17 of 48 positions shown; findings below may reference images not displayed]

FINDINGS: The recent CT showed a calcified meningioma arising along the medial right 
petrous bone. On today's examination the lesion extends 2.3 x 1.8 cm, abutting 
but not deforming the right transverse and sigmoid sinus. This contacts the 
undersurface of the right tentorium. Vertical height is 2.3 cm. This is located 
posterior to the IAC, with the anterior margin of the mass approximately 6 mm 
posterior to the posterior margin of the porus acusticus. This indents the right 
cerebellar hemisphere without edema. The fourth ventricle is open. No other 
pathologic enhancement or mass identified. 
Diffusion images are negative. There is artifactual signal on diffusion-weighted 
images within the tectum and left midbrain. There is ventriculomegaly which 
appears chronic. There are moderate chronic-appearing white matter 
microangiopathic changes. There is mild to moderate cortical atrophy, most 
pronounced along the anterolateral frontal convexities. There is mild cerebellar 
volume loss. The craniocervical junction is open. There is no sellar mass. 
Major intracranial arterial segments are open. There has been ocular lens 
implantation. Paranasal sinuses and otomastoid spaces are clear.
IMPRESSION: 2.3 cm extra-axial neoplasm, most likely meningioma, arising from the medial 
right petrous bone, posterior to the IAC. This produces mild deformity on the 
right cerebellar hemisphere. This contacts but does not deform the right 
transverse and sigmoid sinus. 
No other intracranial mass. There is mild to moderate cortical atrophy and 
moderately extensive chronic-appearing cerebral white matter microangiopathic 
change. Ventriculomegaly is most likely chronic and well compensated, in part 
reflecting white matter volume loss. The fourth ventricle is open and shows 
normal caliber.

## 2019-05-15 IMAGING — MR MRI BRAIN W/WO CONTRAST
4 of 11 series · 17 of 48 positions shown · IV contrast (gadavist)
Comparison: Brain MRI February 02, 2019

MRI BRAIN W/WO CONTRAST,05/15/2019 [DATE]: 
CLINICAL INDICATION: Benign neoplasm of cerebral meninges, follow-up evaluation, 
stage III renal disease
TECHNIQUE: Axial T1, Axial T2, Axial FLAIR, Diffusion weighted images, Sagittal 
T1, Enhanced Axial T1, and Enhanced coronal fat-suppressed T1 were obtained. 7 
cc's of was injected intravenously by hand. Gadavist The patient's eGFR was 
calculated to be 68.3 using the i-STAT device.

[Series 101: survey · axial · 10.0mm · 0.94mm/px · 1 of 5 slices shown]
[im 1/5]
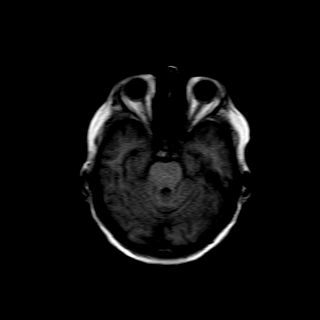

[Series 601: SWI · axial · 3.0mm · 0.40mm/px · z∈[-59,+75]mm · 9 of 200 slices shown]
[im 10/200]
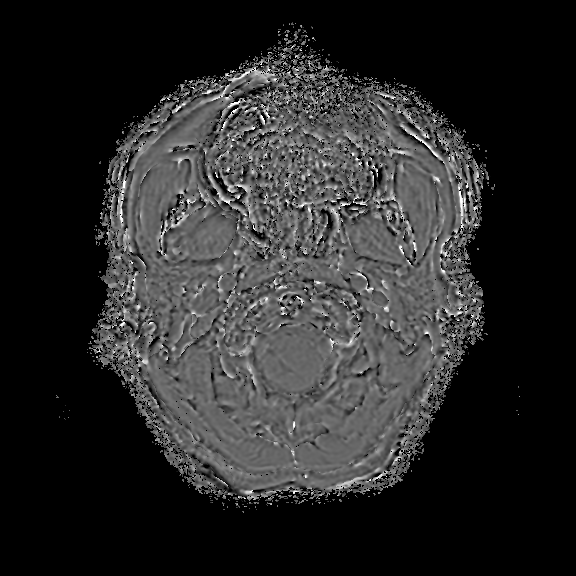
[im 30/200]
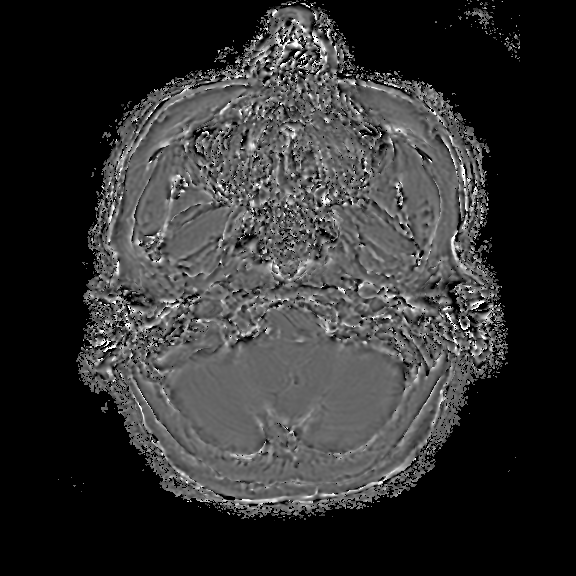
[im 60/200]
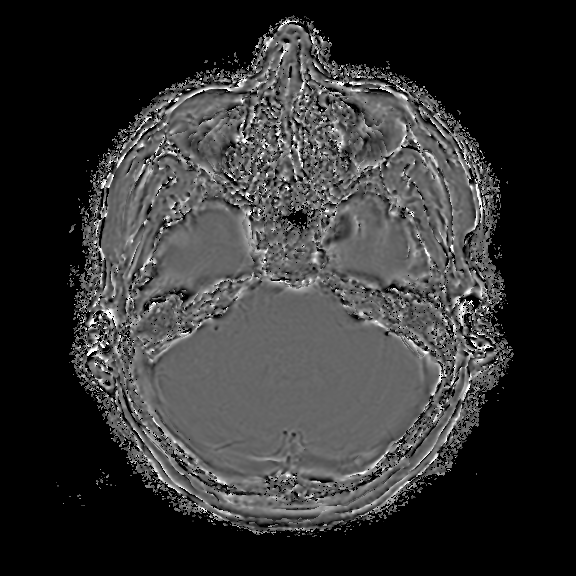
[im 90/200]
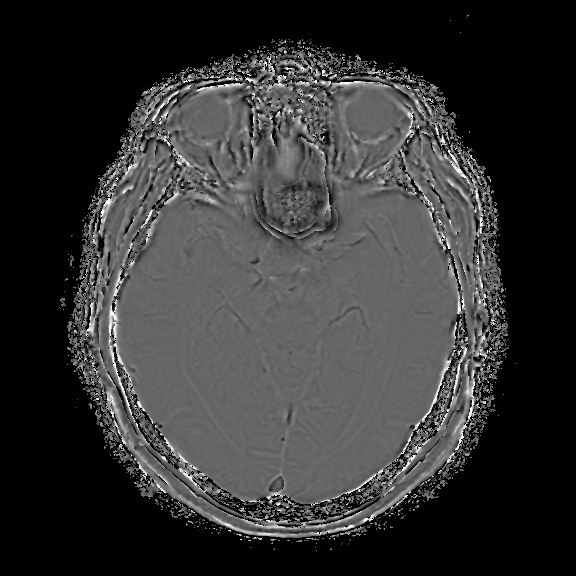
[im 100/200]
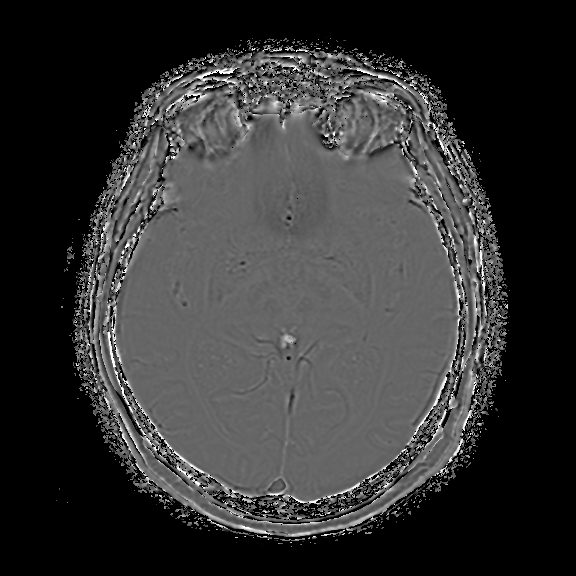
[im 110/200]
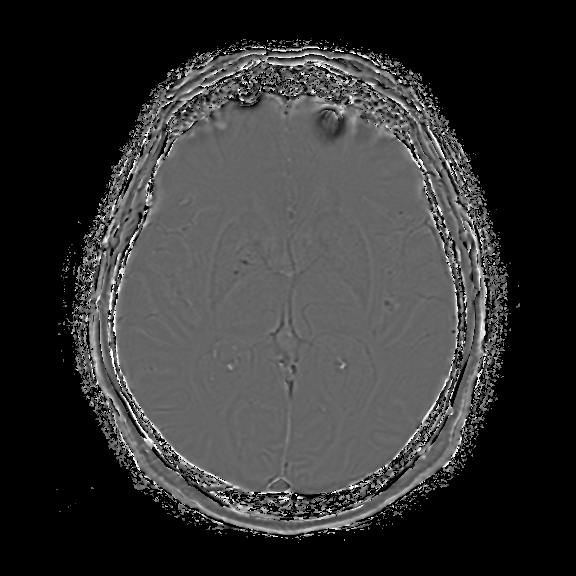
[im 140/200]
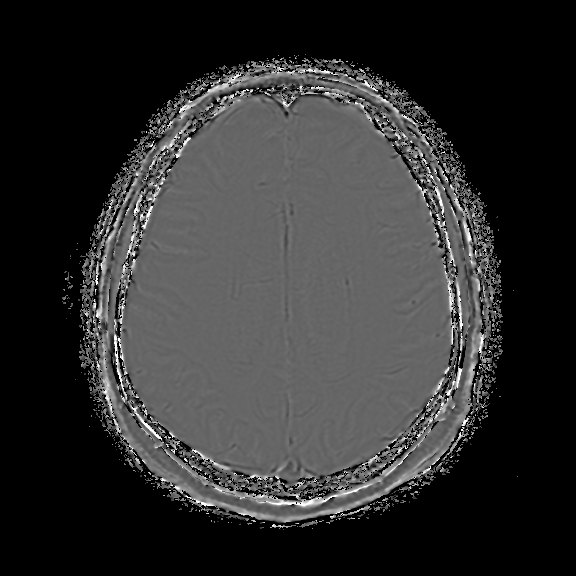
[im 170/200]
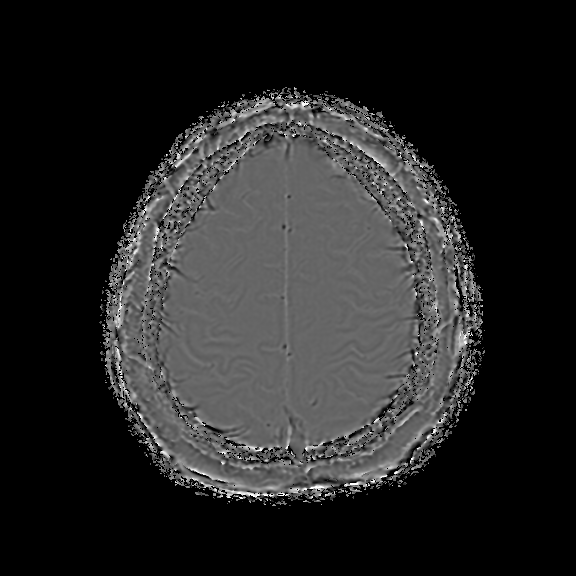
[im 190/200]
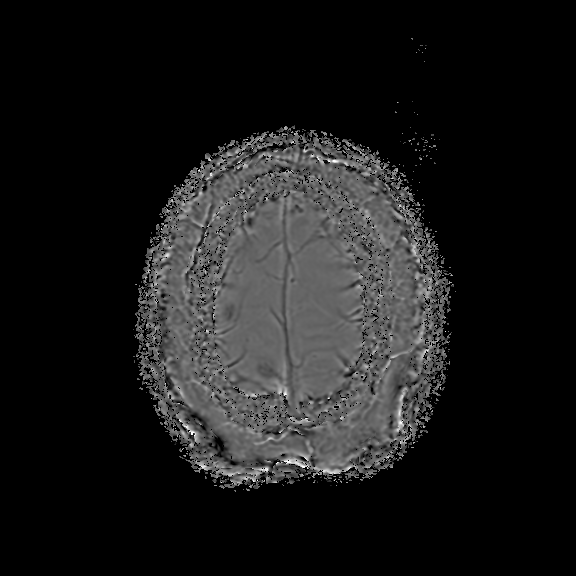

[Series 801: T1 post-contrast · axial · 5.0mm · 0.43mm/px · z∈[-69,+87]mm · 3 of 27 slices shown (1 of 2)]
[im 1/27]
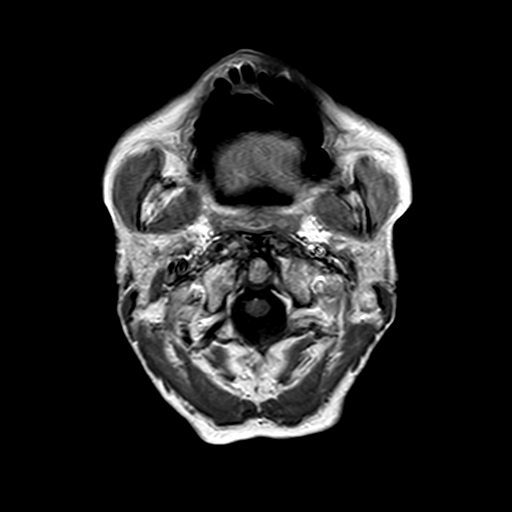
[im 14/27]
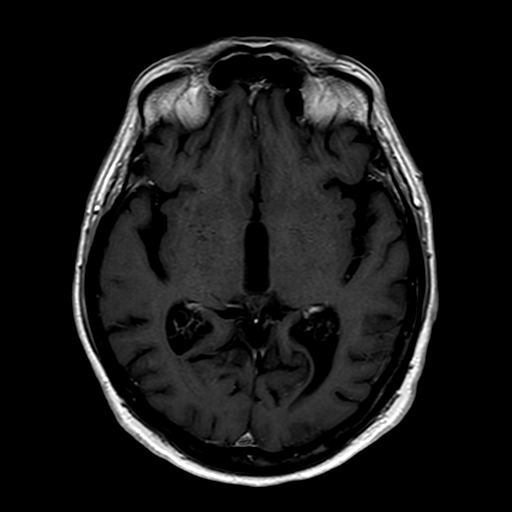
[im 27/27]
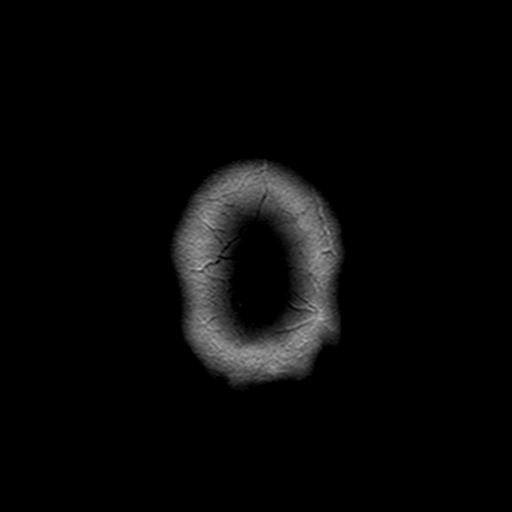

[Series 901: T1 post-contrast · coronal · 4.0mm · 0.41mm/px · 4 of 34 slices shown (2 of 2)]
[im 1/34]
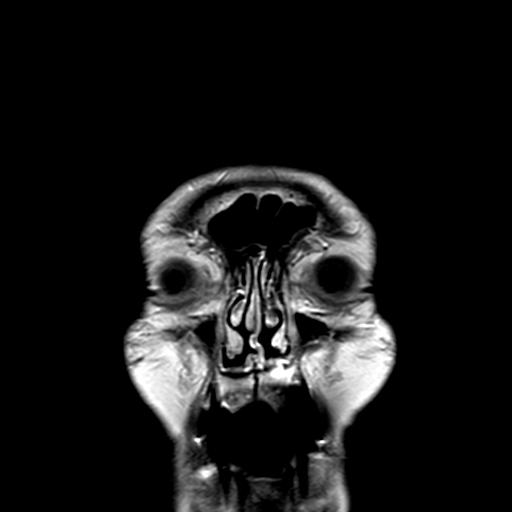
[im 12/34]
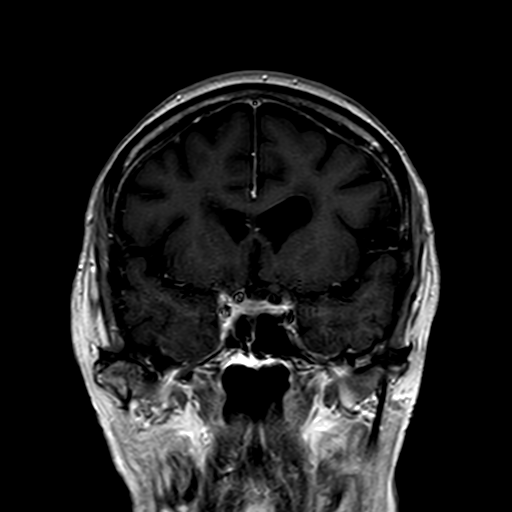
[im 23/34]
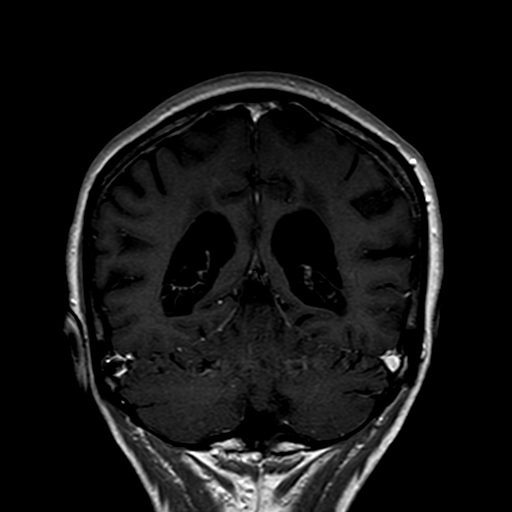
[im 34/34]
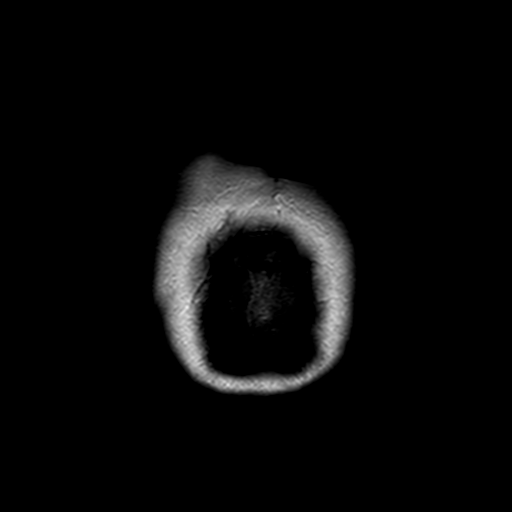

[17 of 48 positions shown; findings below may reference images not displayed]

FINDINGS: The enhancing mass arising along the medial right petrous bone measures 2.3 cm 
in coronal plane, 1.8 cm AP dimension on axial enhanced image 8, previously
x 1.7 cm. On enhanced coronal images vertical height is 2.3 cm, coronal 
measurement 2.6 cm, previously 2.3 x 2.4 cm. The lesion abuts the tentorium and 
right sigmoid sinus. The dural sinus remains open. The lesion is isointense on 
T2-weighted images, most likely meningioma. There is mild deformity on the right 
cerebellar hemisphere, unchanged. Fourth ventricle is open there is enhancement 
of the crista galli, which measures 8 mm in AP dimension, 6 mm coronal plane, 
enhanced axial image 13. This enhancement was not present on the prior study. 
Although rare, meningioma or other neoplasm cannot be excluded. 
No other potential concerning enhancement. Cavernous sinuses are unremarkable. 
There is no sellar mass. There is mild to moderate cerebral cortical atrophy. 
There are moderate chronic-appearing cerebral white matter microangiopathic 
changes. Diffusion images are unremarkable. There is mild ventriculomegaly, 
showing stability. There is no focal brainstem lesion. There has been ocular 
lens implantation. The craniocervical junction is open.. Major intracranial 
arterial segments are open. Sellar contents are normal. Craniocervical junction 
is open. Paranasal sinuses and otomastoid spaces are clear.
IMPRESSION: No change in right petrous meningioma, or mild effacement of the right 
cerebellar hemisphere. This abuts but does not occlude the right sigmoid sinus. 
Further surveillance follow-up enhanced MRI recommended. 
Enhancement of the crista galli, not present on the prior enhanced images. This 
is indeterminant, but neoplastic involvement cannot be excluded. This can also 
be evaluated with surveillance follow-up. 
Mild to moderate atrophy, ventriculomegaly and white matter microangiopathic 
changes are not significantly different.

## 2019-09-12 IMAGING — MR MRI BRAIN W/WO CONTRAST
5 of 11 series · 17 of 48 positions shown · IV contrast (gadavist)
Comparison: Cranial MRI May 15, 2019

MRI BRAIN W/WO CONTRAST,09/12/2019 [DATE]: 
CLINICAL INDICATION: Benign neoplasm of cerebral meninges, stage III kidney 
disease
TECHNIQUE: Axial T1, Axial T2, Axial FLAIR, Diffusion weighted images, Sagittal 
T1, Enhanced Axial T1, and Enhanced coronal fat-suppressed T1 were obtained.
cc's of gadavist was injected intravenously by hand. The patient's eGFR was 
calculated to be 68.2 using the i-STAT device.

[Series 401: FLAIR · axial · 5.0mm · 0.65mm/px · z∈[-93,+62]mm · 3 of 27 slices shown]
[im 1/27]
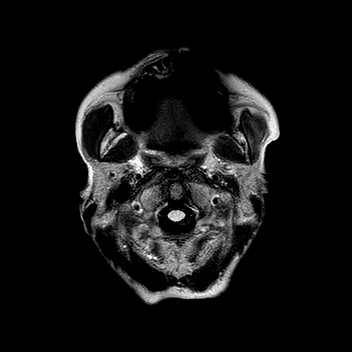
[im 14/27]
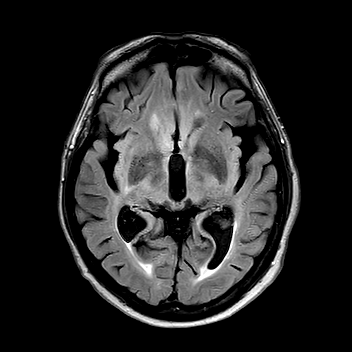
[im 27/27]
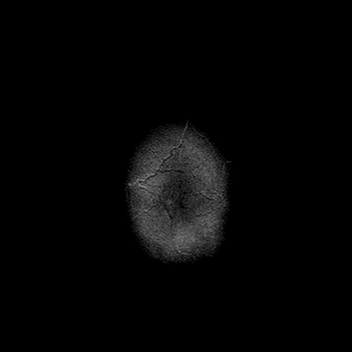

[Series 601: SWI · axial · 3.0mm · 0.53mm/px · 1 of 100 slices shown]
[im 1/100]
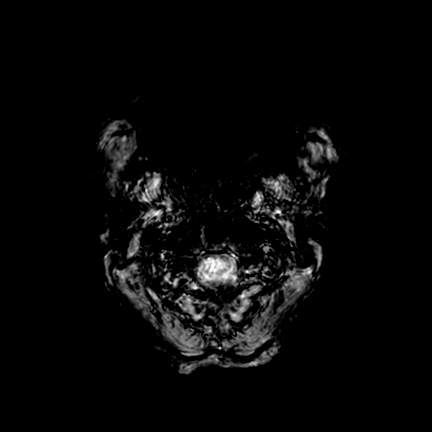

[Series 701: T2 · axial · 5.0mm · 0.41mm/px · z∈[-93,+62]mm · 4 of 27 slices shown]
[im 1/27]
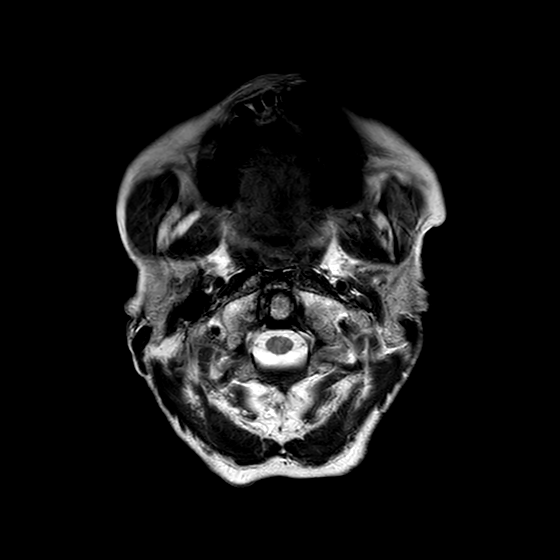
[im 9/27]
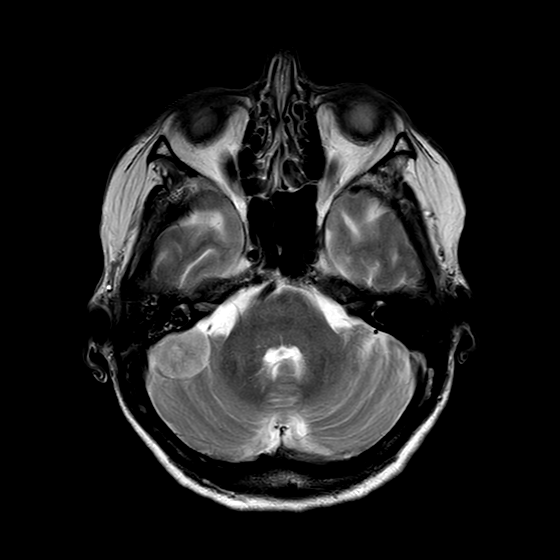
[im 18/27]
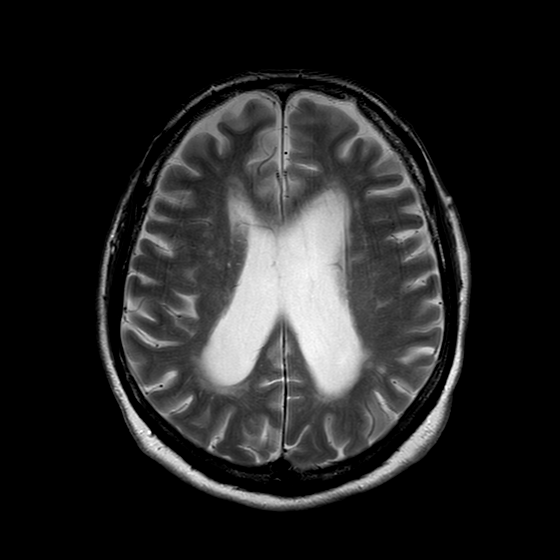
[im 27/27]
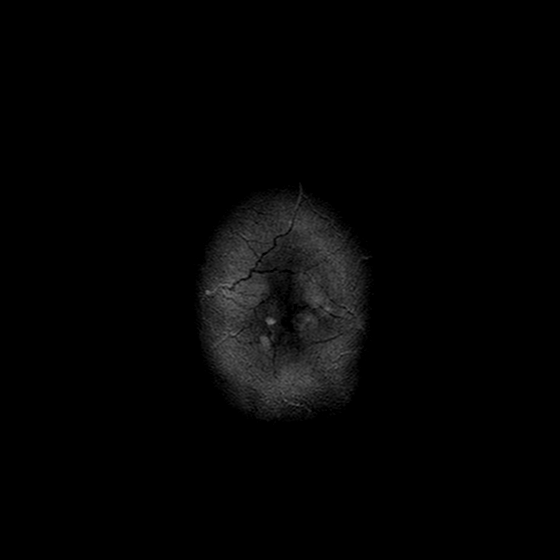

[Series 901: T1 post-contrast · coronal · 4.0mm · 0.38mm/px · 5 of 34 slices shown (1 of 2)]
[im 1/34]
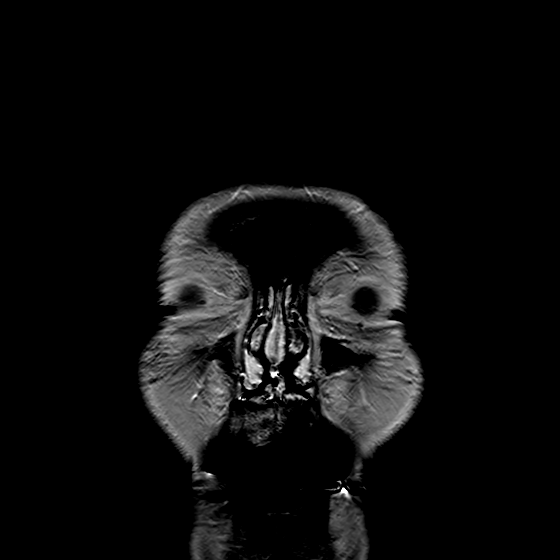
[im 9/34]
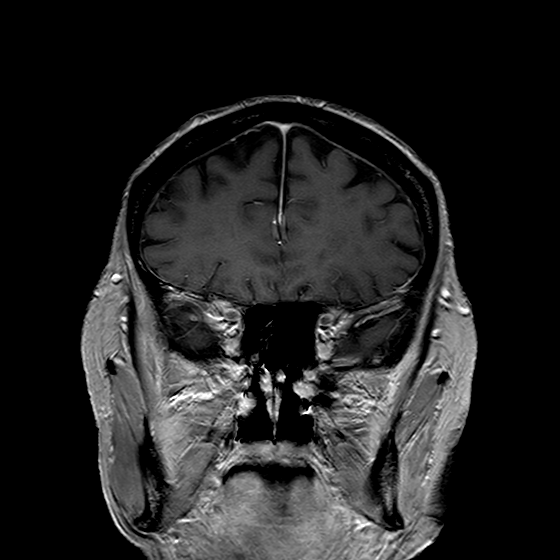
[im 17/34]
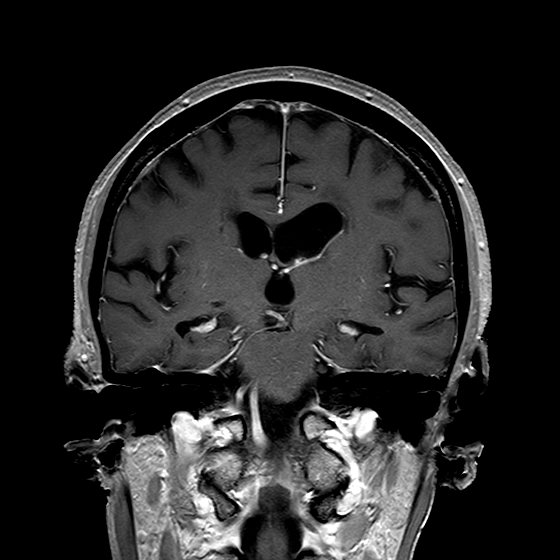
[im 25/34]
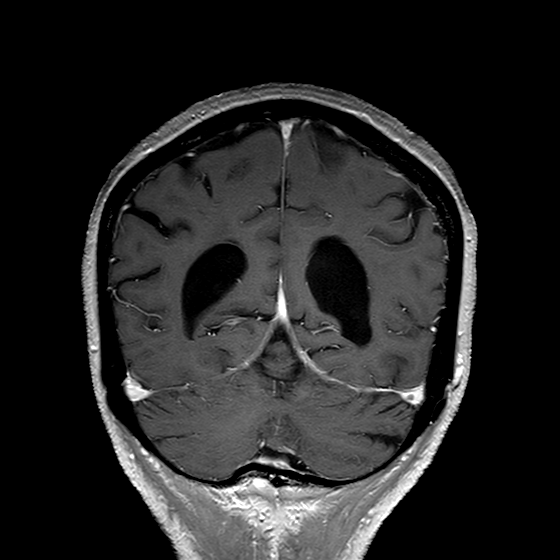
[im 34/34]
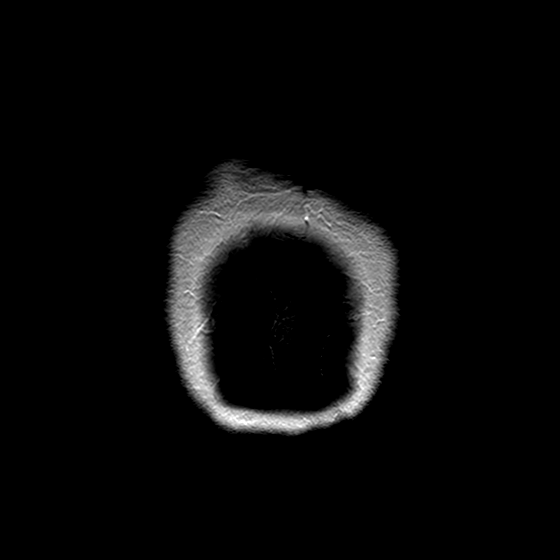

[Series 1001: T1 post-contrast · sagittal · 4.0mm · 0.34mm/px · 4 of 27 slices shown (2 of 2)]
[im 1/27]
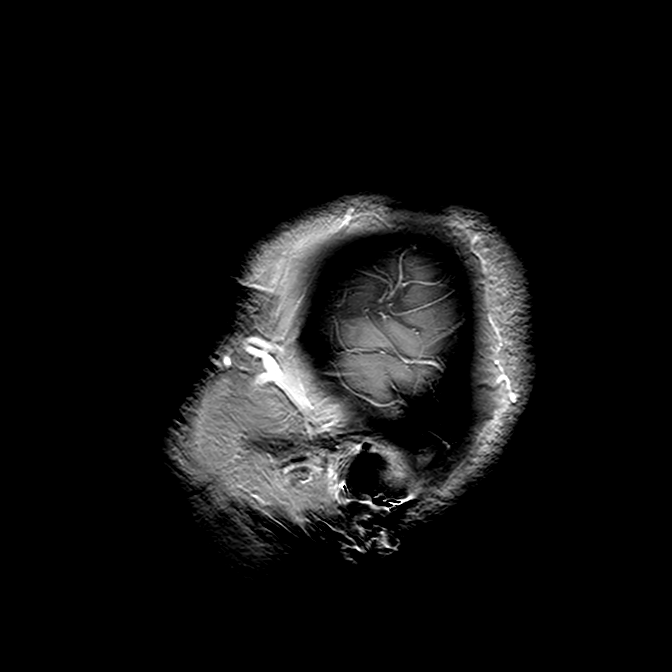
[im 9/27]
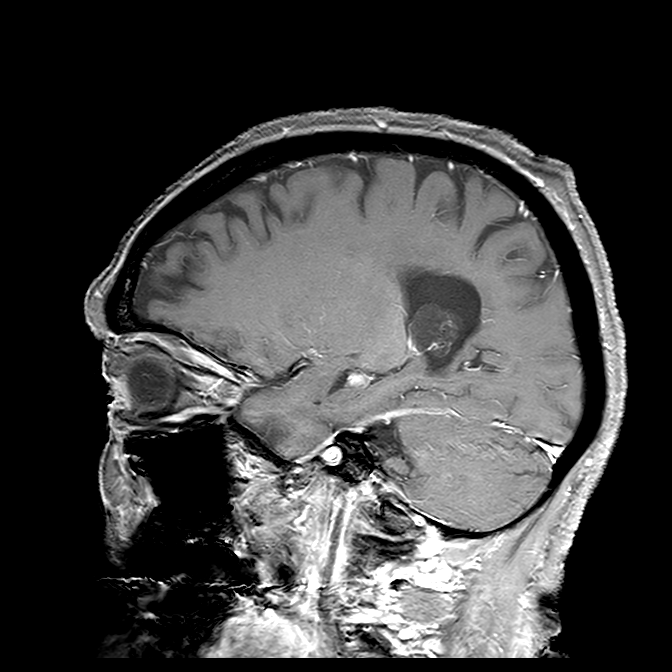
[im 18/27]
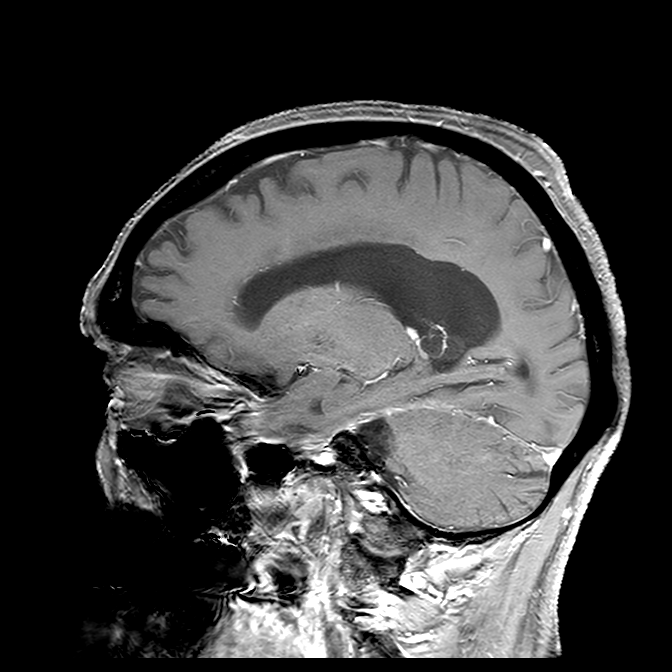
[im 27/27]
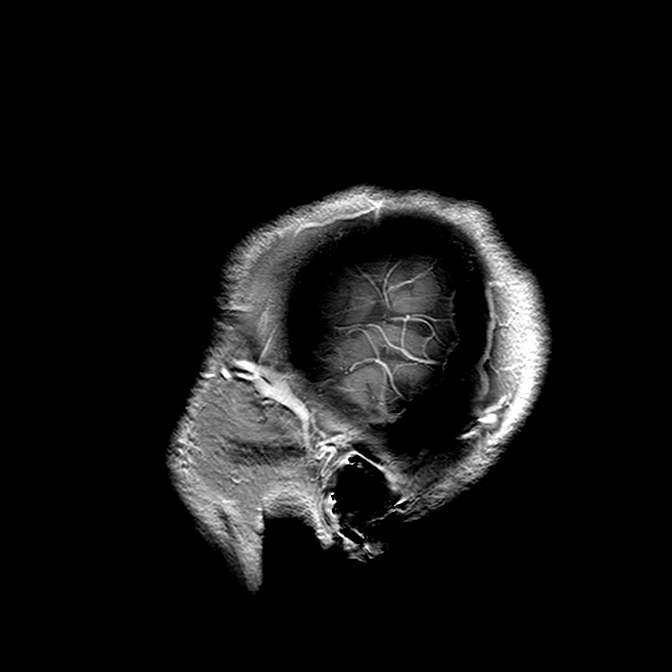

[17 of 48 positions shown; findings below may reference images not displayed]

FINDINGS: Again identified is an extra-axial mass arising from the medial right 
petrous bone measuring 2.4 x 1.8 cm, not different from the prior study. This 
indents the adjacent cerebellum. The fourth ventricle is open. The lesion lies 
in proximity with the right sigmoid sinus without compression or occlusion. 
Major dural sinuses remain open. The right jugular is dominant. 
No other intracranial mass. There is stable ventriculomegaly. Diffusion images 
are negative. The craniocervical junction is open. Sellar contents are normal. 
Susceptibility images show no parenchymal hemosiderin. FLAIR sequences show mild 
to moderate increased T2 signal, similar to the prior study. 
Paranasal sinuses and otomastoid spaces are predominantly clear. There has been 
ocular lens implantation. Major arterial segments are open. There is no evidence 
for aneurysm or vascular malformation. There appear to be TMJ degenerative 
changes, best seen on coronal views.
IMPRESSION: No significant change in right medial petrous meningioma or degree of mass 
effect on the adjacent cerebellum. This is adjacent to but does not compromise 
the right sigmoid sinus. 
No new intracranial mass. No evidence for recent infarct or hemorrhage. 
Stable ventriculomegaly, and stable periventricular increased signal. Normal 
pressure hydrocephalus cannot be excluded. 
Mild to moderate chronic microangiopathic foci elsewhere in the cerebral white 
matter, not significantly different. 
Major and cranial arterial segments are open. There
